# Patient Record
Sex: Female | Born: 1960 | Hispanic: Yes | Marital: Married
Health system: Northeastern US, Academic
[De-identification: ages and names within clinical notes are randomized; demographics above are authoritative.]

---

## 2019-10-19 MED ORDER — GABAPENTIN 300 MG CAPSULE
300 | ORAL_CAPSULE | Freq: Every evening | ORAL | 2 refills | 30.00000 days | Status: AC
Start: 2019-10-19 — End: 2020-03-16

## 2019-12-07 ENCOUNTER — Encounter: Admit: 2019-12-07 | Payer: PRIVATE HEALTH INSURANCE | Attending: Dermatology | Primary: Internal Medicine

## 2019-12-07 ENCOUNTER — Telehealth: Admit: 2019-12-07 | Payer: PRIVATE HEALTH INSURANCE | Attending: Dermatology | Primary: Internal Medicine

## 2019-12-07 NOTE — Telephone Encounter
Spoke with patient and recommended her to see Dr. Brooke Dare regarding hair loss.

## 2019-12-07 NOTE — Telephone Encounter
Patient wants name of md/referral for hair loss.   Please advise

## 2020-01-21 ENCOUNTER — Ambulatory Visit: Admit: 2020-01-21 | Payer: PRIVATE HEALTH INSURANCE | Attending: Internal Medicine | Primary: Internal Medicine

## 2020-01-21 ENCOUNTER — Inpatient Hospital Stay: Admit: 2020-01-21 | Discharge: 2020-01-21 | Payer: BLUE CROSS/BLUE SHIELD | Primary: Internal Medicine

## 2020-01-21 DIAGNOSIS — L659 Nonscarring hair loss, unspecified: Secondary | ICD-10-CM

## 2020-01-21 MED ORDER — CLOBETASOL 0.05 % SCALP SOLUTION
0.05 | TOPICAL | 2.00 refills | 30.00000 days | Status: AC
Start: 2020-01-21 — End: ?

## 2020-01-25 ENCOUNTER — Encounter: Admit: 2020-01-25 | Payer: PRIVATE HEALTH INSURANCE | Attending: Dermatology | Primary: Internal Medicine

## 2020-01-25 DIAGNOSIS — Z411 Encounter for cosmetic surgery: Secondary | ICD-10-CM

## 2020-01-25 DIAGNOSIS — U071 COVID-19: Secondary | ICD-10-CM

## 2020-01-25 DIAGNOSIS — S0300XA Dislocation of jaw, unspecified side, initial encounter: Secondary | ICD-10-CM

## 2020-01-25 DIAGNOSIS — K5190 Ulcerative colitis, unspecified, without complications: Secondary | ICD-10-CM

## 2020-01-25 DIAGNOSIS — E039 Hypothyroidism, unspecified: Secondary | ICD-10-CM

## 2020-01-25 DIAGNOSIS — E7800 Pure hypercholesterolemia, unspecified: Secondary | ICD-10-CM

## 2020-01-25 DIAGNOSIS — R040 Epistaxis: Secondary | ICD-10-CM

## 2020-01-25 NOTE — Patient Instructions
Post-Microneedling InstructionsAvoid vigorous exercise for first 24 hours after the procedure.Do not exfoliate the skin in the two weeks following the procedure.  Apply cold compresses for comfort as needed in the first few days after treatment.Tylenol as needed for swelling/discomfort. Avoid use of NSAIDS (ibuprofen or advil) for first 3 days after the procedure.Sleep with head elevated first night after treatment to help minimize swelling.Avoid application of active skin care items (i.e. Vit C, glycolic acid, Retin-A) - can restart 7 days after procedureAvoid direct sun exposure for at least 1 month after treatmentWhat to expect:Days 1-2: Redness, mild swelling.  Bruising can occur, but is less common. Mild scabbing or crusting may occur.Days 3-5: Pink color that fades.  Can resume regular application of make-upDay 7: Can resume normal skin care regimenSkin Care Post Microneedling TreatmentAM:Wash with Gentle Cleanser  - cerave, cetaphil, neutrogena,  La roche posay - samples providedApply hyaluronic acid serum provided to treatment area after washing wash Apply Moisturizer with SPF 30+ - mineral sunscreen preferable (i.e. Zinc, Al, Titanium) - samples providedPM:Wash with Gentle Cleanser (see above)Apply hyaluronic acid serum provided to treatment area after washing wash Moisturize with Bland, fragrance-free moisturizer - i.e. EltaMD Moisturizer, CeraVe Cream, CeraVe Ointment, Neutrogena hydrogel or Aquaphor - samples providedPost-[place] Resolve InstructionsAvoid vigorous exercise for first 24 hours after the procedure.Do not exfoliate the skin in the two weeks following the procedure.  Allow the peeling/sloughing process to occur naturally.Apply cold compresses for comfort as needed in the first few days after treatment. Apply for 10-15 minutes and repeat every hour as needed, particularly on the evening of treatment.You may shower or bathe normally, but avoid rubbing the skin with a washcloth, motorized brush or scrub.Tylenol as needed for swelling/discomfort. Sleep with head elevated first night after treatment to help minimize swelling.Can resume wearing make-up after 3 days.Avoid direct sun exposure for at least 3 months after treatmentIf you have a history of cold sores, please notify us.IF YOU DEVELOP BLISTERS OR SORES IN THE TREATMENT AREA, CALL THE OFFICE IMMEDIATELYWhat to expect:Days 1-2: Redness, mild swellingDays 1-4: Skin may develop sandpaper texture.  Mild swelling will resolve by day 4Days 3-5:  Resolution of redness from the deeper Resolve treatment.Peak results in 1-3 months Skin Care Post Resolve Laser Treatment - Can resume normal regimen at 1 weekAM:Wash with Gentle Cleanser  - cerave, cetaphil, neutrogena gentle skin,  La roche posay - samples providedApply Moisturizer with SPF 30+ - mineral sunscreen preferable (i.e. Zinc, Al, Titanium) - EltaMD SPF, LaRoche posay mineral SPF, Neutrogena Sheer Zinc - samples providedPM:Wash with Gentle Cleanser - as aboveMoisturize with Bland, fragrance-free moisturizer - i.e. EltaMD Moisturizer or PM therapy or Barrier renewal, LaRoche Double repair moisturizer or CeraVe Cream, CeraVe Ointment, Neutrogena hydrogel or Aquaphor - samples providedTAKE VALTREX MEDICATION TWICE DAILY IF PRESCRIBED.

## 2020-01-28 NOTE — Progress Notes
Please see Media folder for detailed note.

## 2020-02-26 ENCOUNTER — Ambulatory Visit: Admit: 2020-02-26 | Payer: BLUE CROSS/BLUE SHIELD | Primary: Internal Medicine

## 2020-02-26 ENCOUNTER — Ambulatory Visit: Admit: 2020-02-26 | Payer: PRIVATE HEALTH INSURANCE | Primary: Internal Medicine

## 2020-02-26 DIAGNOSIS — Z23 Encounter for immunization: Secondary | ICD-10-CM

## 2020-03-16 MED ORDER — GABAPENTIN 300 MG CAPSULE
300 | ORAL_CAPSULE | Freq: Every evening | ORAL | 2 refills | 30.00000 days | Status: AC
Start: 2020-03-16 — End: 2020-07-25

## 2020-03-16 MED ORDER — ROSUVASTATIN 5 MG TABLET
5 | ORAL_TABLET | ORAL | 4 refills | 90.00000 days | Status: AC
Start: 2020-03-16 — End: 2021-05-05

## 2020-03-18 ENCOUNTER — Ambulatory Visit: Admit: 2020-03-18 | Payer: PRIVATE HEALTH INSURANCE | Primary: Internal Medicine

## 2020-03-18 DIAGNOSIS — Z23 Encounter for immunization: Secondary | ICD-10-CM

## 2020-03-29 ENCOUNTER — Telehealth: Admit: 2020-03-29 | Payer: PRIVATE HEALTH INSURANCE | Attending: Dermatology | Primary: Internal Medicine

## 2020-03-29 NOTE — Telephone Encounter
Pt returning [place] call

## 2020-03-29 NOTE — Telephone Encounter
I called patient back returning her call. Patient had micro needling to scar on nose and states that she sees no improvement. I advised her that it can sometimes take more than one treatment. I advised her to e-mail a photo. She states that she will wait a little longer to see what she decides to do. I advised her to please call the office if she has any further questions or concerns.

## 2020-03-29 NOTE — Telephone Encounter
Patient called stating she would like to speak to nurse Sherri Rad in regards to the procedure she had on 01/25/2020 (microneedling on a scar).

## 2020-03-29 NOTE — Telephone Encounter
I called patient back today returning her call. Patient was unavailable. I left a message on voicemail advising patient to call the office when she is available.

## 2020-05-22 MED ORDER — AZELASTINE 137 MCG-FLUTICASONE 50 MCG/SPRAY NASAL SPRAY
137-50 | NASAL | 6 refills | 30.00000 days | Status: AC
Start: 2020-05-22 — End: 2021-09-01

## 2020-05-23 MED ORDER — ESTRADIOL 10 MCG VAGINAL TABLET
10 | ORAL_TABLET | VAGINAL | 1 refills | 63.00000 days | Status: AC
Start: 2020-05-23 — End: 2020-09-14

## 2020-06-18 ENCOUNTER — Inpatient Hospital Stay: Admit: 2020-06-18 | Discharge: 2020-06-18 | Payer: BLUE CROSS/BLUE SHIELD | Primary: Internal Medicine

## 2020-06-18 DIAGNOSIS — Z8049 Family history of malignant neoplasm of other genital organs: Secondary | ICD-10-CM

## 2020-06-24 MED ORDER — MISOPROSTOL 200 MCG TABLET
200 | ORAL_TABLET | ORAL | 1 refills | 1.00000 days | Status: AC
Start: 2020-06-24 — End: 2020-07-22

## 2020-07-25 MED ORDER — GABAPENTIN 300 MG CAPSULE
300 | ORAL_CAPSULE | ORAL | 4 refills | 30.00000 days | Status: AC
Start: 2020-07-25 — End: 2021-05-05

## 2020-08-06 ENCOUNTER — Inpatient Hospital Stay: Admit: 2020-08-06 | Discharge: 2020-08-06 | Payer: BLUE CROSS/BLUE SHIELD | Primary: Internal Medicine

## 2020-08-06 ENCOUNTER — Ambulatory Visit: Admit: 2020-08-06 | Payer: PRIVATE HEALTH INSURANCE | Attending: Internal Medicine | Primary: Internal Medicine

## 2020-08-06 DIAGNOSIS — Z0000 Encounter for general adult medical examination without abnormal findings: Secondary | ICD-10-CM

## 2020-08-06 DIAGNOSIS — E038 Other specified hypothyroidism: Secondary | ICD-10-CM

## 2020-08-06 DIAGNOSIS — K5180 Other ulcerative colitis without complications: Secondary | ICD-10-CM

## 2020-08-06 DIAGNOSIS — E7800 Pure hypercholesterolemia, unspecified: Secondary | ICD-10-CM

## 2020-08-07 MED ORDER — LEVOTHYROXINE 50 MCG TABLET
50 | ORAL_TABLET | ORAL | 4 refills | 90.00000 days | Status: AC
Start: 2020-08-07 — End: 2021-05-05

## 2020-09-14 MED ORDER — ESTRADIOL 10 MCG VAGINAL TABLET
10 | ORAL_TABLET | VAGINAL | 1 refills | 63.00000 days | Status: AC
Start: 2020-09-14 — End: 2020-11-11

## 2020-10-28 ENCOUNTER — Encounter: Admit: 2020-10-28 | Payer: PRIVATE HEALTH INSURANCE | Attending: Internal Medicine | Primary: Internal Medicine

## 2020-10-28 ENCOUNTER — Ambulatory Visit: Admit: 2020-10-28 | Payer: PRIVATE HEALTH INSURANCE | Attending: Internal Medicine | Primary: Internal Medicine

## 2020-10-28 ENCOUNTER — Inpatient Hospital Stay: Admit: 2020-10-28 | Discharge: 2020-10-28 | Payer: BLUE CROSS/BLUE SHIELD | Primary: Internal Medicine

## 2020-10-28 DIAGNOSIS — E038 Other specified hypothyroidism: Secondary | ICD-10-CM

## 2020-10-28 DIAGNOSIS — M26609 Unspecified temporomandibular joint disorder, unspecified side: Secondary | ICD-10-CM

## 2020-10-28 DIAGNOSIS — E559 Vitamin D deficiency, unspecified: Secondary | ICD-10-CM

## 2020-10-28 DIAGNOSIS — K5190 Ulcerative colitis, unspecified, without complications: Secondary | ICD-10-CM

## 2020-10-28 DIAGNOSIS — M549 Dorsalgia, unspecified: Secondary | ICD-10-CM

## 2020-10-28 DIAGNOSIS — E7800 Pure hypercholesterolemia, unspecified: Secondary | ICD-10-CM

## 2020-10-28 DIAGNOSIS — S0300XA Dislocation of jaw, unspecified side, initial encounter: Secondary | ICD-10-CM

## 2020-10-28 DIAGNOSIS — Z0000 Encounter for general adult medical examination without abnormal findings: Secondary | ICD-10-CM

## 2020-10-28 LAB — BASIC METABOLIC PANEL
BKR ANION GAP: 14 (ref 7–17)
BKR BLOOD UREA NITROGEN: 10 mg/dL (ref 6–20)
BKR BUN / CREAT RATIO: 14.5 (ref 8.0–23.0)
BKR CALCIUM: 10.6 mg/dL — ABNORMAL HIGH (ref 8.8–10.2)
BKR CHLORIDE: 100 mmol/L (ref 98–107)
BKR CO2: 25 mmol/L (ref 20–30)
BKR CREATININE: 0.69 mg/dL (ref 0.40–1.30)
BKR EGFR (AFR AMER): >60 mL/min/{1.73_m2} (ref >60)
BKR EGFR (NON AFRICAN AMERICAN): >60 mL/min/{1.73_m2} (ref >60)
BKR GLUCOSE: 88 mg/dL (ref 70–100)
BKR POTASSIUM: 4.6 mmol/L (ref 3.3–5.3)
BKR SODIUM: 139 mmol/L (ref 136–144)

## 2020-10-28 LAB — C-REACTIVE PROTEIN     (CRP): BKR C-REACTIVE PROTEIN, HIGH SENSITIVITY: 3.1 mg/L — ABNORMAL HIGH

## 2020-10-28 LAB — RHEUMATOID FACTOR: BKR RHEUMATOID FACTOR: <10 IU/mL (ref <14.0)

## 2020-10-28 LAB — SEDIMENTATION RATE (ESR): BKR SEDIMENTATION RATE, ERYTHROCYTE: 14 mm/hr (ref 0–20)

## 2020-10-28 LAB — CK     (BH GH L LMW YH): BKR CREATINE KINASE TOTAL: 89 U/L (ref 11–204)

## 2020-10-30 LAB — LYME ANTIBODIES W/RFLX TO CONFIRM (MODIFIED TWO-TIER TESTING)    (BH GH LMW YH): BKR LYME ANTIBODY: 0.25 ng/mL (ref <=0.90)

## 2020-11-01 LAB — ANA TITER AND PATTERN W/RFLX   (BH GH)
BKR ANA PATTERN: 100 mmol/L (ref 98–107)
BKR ANA TITER: 1:160 {titer} — AB (ref 3.3–5.3)

## 2020-11-01 LAB — ANA IFA SCREEN W/REFL TO TITER AND PATTERN, IFA: BKR ANTI-NUCLEAR ANTIBODY (ANA): POSITIVE — AB

## 2020-11-01 LAB — CYCLIC CITRUL PEPTIDE ANTIBODY, IGG
BKR CCP AB, IGG: 2.4 {ELISA'U} (ref <7.0)
BKR LDL CHOLESTEROL CALCULATED: 2.4 EliA U/mL — ABNORMAL HIGH (ref <7.0)

## 2020-11-03 LAB — VITAMIN D, 25-HYDROXY
BKR VITAMIN D, 25-HYDROXY TOTAL (YH): 50 ng/mL
BKR VITAMIN D2, 25-HYDROXY: <5 ng/mL
BKR VITAMIN D3, 25-HYDROXY: 50 ng/mL (ref 0.0–5.0)

## 2020-11-04 ENCOUNTER — Inpatient Hospital Stay: Admit: 2020-11-04 | Discharge: 2020-11-04 | Payer: BLUE CROSS/BLUE SHIELD | Primary: Internal Medicine

## 2020-11-04 DIAGNOSIS — R768 Other specified abnormal immunological findings in serum: Secondary | ICD-10-CM

## 2020-11-07 LAB — EXTRACTABLE NUCLEAR ANTIGEN ABS GROUP     (BH LMW YH)
BKR RNP (U1RNP) AB: 2.6 {ELISA'U} (ref <5.0)
BKR SM AB: 4.7 EliA U/mL (ref <7.0)
BKR SS-A AB: 0.7 {ELISA'U} (ref <7.0)
BKR SS-B AB: 0.7 EliA U/mL — ABNORMAL HIGH (ref <7.0)

## 2020-11-07 LAB — DSDNA ANTIBODY, IGG WITH IFA CONFIRMATION     (BH LMW YH): BKR DSDNA AB: 1.3 [IU]/mL (ref <10.0)

## 2020-11-11 MED ORDER — ESTRADIOL 10 MCG VAGINAL TABLET
10 | ORAL_TABLET | VAGINAL | 1 refills | 63.00000 days | Status: AC
Start: 2020-11-11 — End: 2020-11-28

## 2020-11-28 MED ORDER — ESTRADIOL 10 MCG VAGINAL TABLET
10 | ORAL_TABLET | VAGINAL | 1 refills | 63.00000 days | Status: AC
Start: 2020-11-28 — End: 2021-01-09

## 2021-01-04 ENCOUNTER — Inpatient Hospital Stay: Admit: 2021-01-04 | Discharge: 2021-01-04 | Payer: BLUE CROSS/BLUE SHIELD | Primary: Internal Medicine

## 2021-01-04 DIAGNOSIS — K760 Fatty (change of) liver, not elsewhere classified: Secondary | ICD-10-CM

## 2021-01-09 MED ORDER — ESTRADIOL 10 MCG VAGINAL TABLET
10 | ORAL_TABLET | VAGINAL | 4 refills | 63.00000 days | Status: AC
Start: 2021-01-09 — End: 2021-12-01

## 2021-01-12 ENCOUNTER — Encounter: Admit: 2021-01-12 | Payer: PRIVATE HEALTH INSURANCE | Attending: Adult Health | Primary: Internal Medicine

## 2021-01-12 DIAGNOSIS — Z01818 Encounter for other preprocedural examination: Secondary | ICD-10-CM

## 2021-01-16 ENCOUNTER — Encounter: Admit: 2021-01-16 | Payer: PRIVATE HEALTH INSURANCE | Primary: Internal Medicine

## 2021-01-16 DIAGNOSIS — E039 Hypothyroidism, unspecified: Secondary | ICD-10-CM

## 2021-01-16 DIAGNOSIS — U071 COVID-19: Secondary | ICD-10-CM

## 2021-01-16 DIAGNOSIS — R9389 Abnormal findings on diagnostic imaging of other specified body structures: Secondary | ICD-10-CM

## 2021-01-16 DIAGNOSIS — K5190 Ulcerative colitis, unspecified, without complications: Secondary | ICD-10-CM

## 2021-01-16 DIAGNOSIS — S0300XA Dislocation of jaw, unspecified side, initial encounter: Secondary | ICD-10-CM

## 2021-01-16 DIAGNOSIS — R040 Epistaxis: Secondary | ICD-10-CM

## 2021-01-16 DIAGNOSIS — E7800 Pure hypercholesterolemia, unspecified: Secondary | ICD-10-CM

## 2021-01-16 MED ORDER — AZELAIC ACID 15 % TOPICAL GEL
15 % | Status: AC
Start: 2021-01-16 — End: ?

## 2021-01-20 ENCOUNTER — Ambulatory Visit: Admit: 2021-01-20 | Payer: PRIVATE HEALTH INSURANCE | Attending: Anesthesiology | Primary: Internal Medicine

## 2021-01-20 ENCOUNTER — Inpatient Hospital Stay: Admit: 2021-01-20 | Discharge: 2021-01-20 | Payer: BLUE CROSS/BLUE SHIELD | Primary: Internal Medicine

## 2021-01-20 DIAGNOSIS — Z20822 Contact with and (suspected) exposure to covid-19: Secondary | ICD-10-CM

## 2021-01-20 DIAGNOSIS — K5120 Ulcerative (chronic) proctitis without complications: Secondary | ICD-10-CM

## 2021-01-20 DIAGNOSIS — Z01812 Encounter for preprocedural laboratory examination: Secondary | ICD-10-CM

## 2021-01-20 DIAGNOSIS — Z01818 Encounter for other preprocedural examination: Secondary | ICD-10-CM

## 2021-01-21 LAB — COVID-19 CLEARANCE OR FOR PLACEMENT ONLY: BKR SARS-COV-2 RNA (COVID-19) (YH): NEGATIVE

## 2021-01-23 ENCOUNTER — Inpatient Hospital Stay: Admit: 2021-01-23 | Discharge: 2021-01-23 | Payer: BLUE CROSS/BLUE SHIELD | Primary: Internal Medicine

## 2021-01-23 ENCOUNTER — Ambulatory Visit: Admit: 2021-01-23 | Payer: PRIVATE HEALTH INSURANCE | Attending: Anesthesiology | Primary: Internal Medicine

## 2021-01-23 ENCOUNTER — Encounter: Admit: 2021-01-23 | Payer: PRIVATE HEALTH INSURANCE | Primary: Internal Medicine

## 2021-01-23 DIAGNOSIS — Z87891 Personal history of nicotine dependence: Secondary | ICD-10-CM

## 2021-01-23 DIAGNOSIS — U071 COVID-19: Secondary | ICD-10-CM

## 2021-01-23 DIAGNOSIS — K769 Liver disease, unspecified: Secondary | ICD-10-CM

## 2021-01-23 DIAGNOSIS — Z888 Allergy status to other drugs, medicaments and biological substances status: Secondary | ICD-10-CM

## 2021-01-23 DIAGNOSIS — Z8616 Personal history of COVID-19: Secondary | ICD-10-CM

## 2021-01-23 DIAGNOSIS — R9389 Abnormal findings on diagnostic imaging of other specified body structures: Secondary | ICD-10-CM

## 2021-01-23 DIAGNOSIS — K5190 Ulcerative colitis, unspecified, without complications: Secondary | ICD-10-CM

## 2021-01-23 DIAGNOSIS — E7800 Pure hypercholesterolemia, unspecified: Secondary | ICD-10-CM

## 2021-01-23 DIAGNOSIS — Z79890 Hormone replacement therapy (postmenopausal): Secondary | ICD-10-CM

## 2021-01-23 DIAGNOSIS — E039 Hypothyroidism, unspecified: Secondary | ICD-10-CM

## 2021-01-23 DIAGNOSIS — S0300XA Dislocation of jaw, unspecified side, initial encounter: Secondary | ICD-10-CM

## 2021-01-23 DIAGNOSIS — R040 Epistaxis: Secondary | ICD-10-CM

## 2021-01-23 DIAGNOSIS — K5120 Ulcerative (chronic) proctitis without complications: Secondary | ICD-10-CM

## 2021-01-23 DIAGNOSIS — Z79899 Other long term (current) drug therapy: Secondary | ICD-10-CM

## 2021-01-23 MED ORDER — PROPOFOL 10 MG/ML INTRAVENOUS EMULSION
10 mg/mL | INTRAVENOUS | Status: DC | PRN
Start: 2021-01-23 — End: 2021-01-23
  Administered 2021-01-23 (×4): 10 mg/mL via INTRAVENOUS

## 2021-01-23 MED ORDER — LIDOCAINE (PF) 20 MG/ML (2 %) INJECTION SOLUTION
20 mg/mL (2 %) | INTRAVENOUS | Status: DC | PRN
Start: 2021-01-23 — End: 2021-01-23
  Administered 2021-01-23: 19:00:00 20 mg/mL (2 %) via INTRAVENOUS

## 2021-01-23 MED ORDER — LACTATED RINGERS INTRAVENOUS SOLUTION
INTRAVENOUS | Status: DC
Start: 2021-01-23 — End: 2021-01-28
  Administered 2021-01-23: 19:00:00 1000.000 mL/h via INTRAVENOUS

## 2021-01-23 NOTE — Anesthesia Pre-Procedure Evaluation
This is a 60 y.o. female scheduled for COLONOSCOPY, FLEXIBLE; DX, W/WO SPECIMENS/COLON DECOMP (SEP PROC) (N/A ).Review of Systems/ Medical HistoryPatient summary, nursing notes, pre-procedure vitals, height, weight and NPO status reviewed.No previous anesthesia concernsAnesthesia Evaluation: Estimated body mass index is 29.1 kg/m? as calculated from the following:  Height as of this encounter: 5' 1 (1.549 m).  Weight as of this encounter: 69.9 kg. CC/HPI: Past Medical History:09/11/2019: COVID-19No date: EpistaxisNo date: HypercholesteremiaNo date: Hypothyroidism9/27/2021: Thickened endometriumNo date: TMJ (dislocation of temporomandibular joint)No date: Ulcerative colitis [facility] Code)Past Surgical History:  Past Surgical History:No date: COLONOSCOPY2006: LAPAROSCOPIC OVARIAN CYSTECTOMYCardiovascular:Patient has a history of: hypercholesterolemia. Gastrointestinal/Genitourinary: -Gastrointestinal Disorders:  Patient has PUD.-Hepatic Disorders:  Patient has liver disease.Endocrine/Metabolic: -Thyroid Disorders:  Patient has hypothyroidism.Physical ExamCardiovascular:    normal exam  Rhythm: regularPulmonary:  normal exam  Patient's breath sounds clear to auscultationAirway:  Mallampati: IITM distance: >3 FBNeck ROM: fullDental:  normal exam  Anesthesia PlanASA 2 The primary anesthesia plan is  general. Anesthesia informed consent obtained. Anesthesia written consent obtainedConsent obtained from: patientThe post operative pain plan is per surgeon management.Plan discussed with Attending.Anesthesiologist's Pre Op NoteI personally evaluated and examined the patient prior to the intra-operative phase of care.

## 2021-01-23 NOTE — Other
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

## 2021-01-23 NOTE — Anesthesia Post-Procedure Evaluation
Anesthesia Post-op NotePatient: Lori Colon-ShermanProcedure(s):  Procedure(s) (LRB):COLONOSCOPY, FLEXIBLE; DX, W/WO SPECIMENS/COLON DECOMP (SEP PROC) (N/A) Patient location: PACULast Vitals:  I have noted the vital signs as listed in the nursing notes.Mental status recovered: patient participates in evaluation: YesVital signs reviewed: YesRespiratory function stable:YesAirway is patent: YesCardiovascular function and hydration status stable: YesPain control satisfactory: YesNausea and vomiting control satisfactory:YesThere were no known complications for this encounter.

## 2021-01-23 NOTE — Discharge Instructions
ColonoscopyCare After Please read the instructions outlined below and refer to this sheet in the next few weeks. These discharge instructions provide you with general information on caring for yourself after you leave the hospital. Your doctor may also give you specific instructions. While your treatment has been planned according to the most current medical practices available, unavoidable complications occasionally occur. If you have any problems or questions after discharge, please call your doctor. HOME CARE INSTRUCTIONS Activity You may resume your regular activity but move at a slower pace for the next 24 hours. Take frequent rest periods for the next 24 hours. Walking will help expel (get rid of) the air and reduce the bloated feeling in your abdomen. No driving until tomorrow (because of the anesthesia used during the test). Do not sign any important legal documents or operate any machinery for 24 hours (because of the anesthesia used during the test).Nutrition Drink plenty of fluids. You may resume your normal diet. Avoid alcoholic beverages for 24 hours or as instructed by your caregiver.Medications You may resume your normal medications unless your caregiver tells you otherwise. What you can expect today You may experience abdominal discomfort such as a feeling of fullness or gas pains. Follow-up If biopsies were performed or polyps removed you should receive a call from the office within 10 days. SEEK IMMEDIATE MEDICAL CARE IF: You have excessive nausea (feeling sick to your stomach) and/or vomiting. You have severe abdominal pain and distention (swelling).  You have a temperature over 100? F (37.8? C). You have rectal bleeding or vomiting of blood.

## 2021-01-23 NOTE — H&P
HPI: 60 y.o. female here for colon Past Medical History: Diagnosis Date ? COVID-19 09/11/2019 ? Epistaxis  ? Hypercholesteremia  ? Hypothyroidism  ? Thickened endometrium 07/11/2020 ? TMJ (dislocation of temporomandibular joint)  ? Ulcerative colitis (HC Code)  Past Surgical History: Procedure Laterality Date ? LAPAROSCOPIC OVARIAN CYSTECTOMY  2006 Social History Socioeconomic History ? Marital status: Married   Spouse name: Not on file ? Number of children: Not on file ? Years of education: Not on file ? Highest education level: Not on file Occupational History ? Not on file Tobacco Use ? Smoking status: Former Smoker ? Smokeless tobacco: Never Used ? Tobacco comment: quit about 7 years ago Vaping Use ? Vaping Use: Never used Substance and Sexual Activity ? Alcohol use: Yes   Alcohol/week: 4.0 standard drinks   Types: 2 Glasses of wine, 2 Cans of beer per week   Comment: occ ? Drug use: Never ? Sexual activity: Not on file Other Topics Concern ? Not on file Social History Narrative ? Not on file Social Determinants of Health Financial Resource Strain:  ? Difficulty of Paying Living Expenses:  Food Insecurity:  ? Worried About Programme researcher, broadcasting/film/video in the Last Year:  ? Barista in the Last Year:  Transportation Needs:  ? Freight forwarder (Medical):  ? Lack of Transportation (Non-Medical):  Physical Activity:  ? Days of Exercise per Week:  ? Minutes of Exercise per Session:  Stress:  ? Feeling of Stress :  Social Connections:  ? Frequency of Communication with Friends and Family:  ? Frequency of Social Gatherings with Friends and Family:  ? Attends Religious Services:  ? Active Member of Clubs or Organizations:  ? Attends Banker Meetings:  ? Marital Status:  Intimate Partner Violence:  ? Fear of Current or Ex-Partner:  ? Emotionally Abused:  ? Physically Abused:  ? Sexually Abused:  Family History Problem Relation Age of Onset ? Uterine cancer Mother  ? Breast cancer Neg Hx  ? Colon cancer Neg Hx  ? Ovarian cancer Neg Hx  Allergies Allergen Reactions ? Methyldibromoglutaronitrile  ? Oleamidopropyl Dimethylamine  Current Outpatient Medications on File Prior to Encounter Medication Sig Dispense Refill ? azelastine-fluticasone (DYMISTA) 137-50 mcg/spray nasal spray USE 1 SPRAY IN BOTH  NOSTRILS TWICE DAILY 69 g 5 ? cholecalciferol, vitamin D3, (VITAMIN D3) 1,000 unit capsule Take 3,000 Units by mouth daily.    ? clobetasoL (TEMOVATE) 0.05 % external solution    ? crisaborole (EUCRISA) 2 % Oint Apply topically to the affected areas on the arms and face as needed for maintenance. 60 g 2 ? estradioL (VAGIFEM) 10 mcg vaginal tablet PLACE 1 INSERT VAGINALLY  TWICE WEEKLY 24 tablet 3 ? gabapentin (NEURONTIN) 300 mg capsule TAKE 2 CAPSULES BY MOUTH AT NIGHT 180 capsule 3 ? levothyroxine (SYNTHROID, LEVOTHROID) 50 MCG tablet TAKE 1 TABLET BY MOUTH  DAILY 90 tablet 3 ? mesalamine (ASACOL HD) 800 mg HD delayed release tablet Take 1,600 mg by mouth daily.   ? mesalamine (CANASA) 1000 MG suppository Place 1 suppository (1,000 mg total) rectally nightly. 90 suppository 0 ? rosuvastatin (CRESTOR) 5 mg tablet TAKE 1 TABLET BY MOUTH  DAILY 90 tablet 3 ? azelaic acid (FINACEA) 15 % gel    No current facility-administered medications on file prior to encounter. Current Outpatient Medications Medication Sig ? azelastine-fluticasone (DYMISTA) 137-50 mcg/spray nasal spray USE 1 SPRAY IN BOTH  NOSTRILS TWICE DAILY ? cholecalciferol, vitamin D3, (VITAMIN D3) 1,000 unit capsule Take  3,000 Units by mouth daily.  ? clobetasoL (TEMOVATE) 0.05 % external solution  ? crisaborole (EUCRISA) 2 % Oint Apply topically to the affected areas on the arms and face as needed for maintenance. ? estradioL (VAGIFEM) 10 mcg vaginal tablet PLACE 1 INSERT VAGINALLY  TWICE WEEKLY ? gabapentin (NEURONTIN) 300 mg capsule TAKE 2 CAPSULES BY MOUTH AT NIGHT ? levothyroxine (SYNTHROID, LEVOTHROID) 50 MCG tablet TAKE 1 TABLET BY MOUTH  DAILY ? mesalamine (ASACOL HD) 800 mg HD delayed release tablet Take 1,600 mg by mouth daily. ? mesalamine (CANASA) 1000 MG suppository Place 1 suppository (1,000 mg total) rectally nightly. ? rosuvastatin (CRESTOR) 5 mg tablet TAKE 1 TABLET BY MOUTH  DAILY ? azelaic acid (FINACEA) 15 % gel  No current facility-administered medications for this encounter. P/EWt 70.3 kg  - BMI 29.05 kg/m? General: in NADHEENT: EOMICV: regular rateLungs: clearAbd: soft, NT/NDA/P60 y.o./female here for colonWritten informed consent obtained. _

## 2021-01-23 NOTE — Other
Post Anesthesia Transfer of Care NotePatient: Lori Colon-ShermanProcedure(s) Performed: Procedure(s) (LRB):COLONOSCOPY, FLEXIBLE; DX, W/WO SPECIMENS/COLON DECOMP (SEP PROC) (N/A) Patient location: PACU Last Vitals: Vitals Value Taken Time BP 83/48 01/23/21 1534 Temp 97 01/23/21 1536 Pulse 74 01/23/21 1535 Resp 16 01/23/21 1536 SpO2 100 % 01/23/21 1535 Vitals shown include unvalidated device data.Level of consciousness: awakeTransport Vital Signs:  Stable since the last set of recorded intra-operative vital signsIntra-operative Complications: noneIntra-operative Intake & Output and Antibiotics as per Anesthesia record and discussed with the RN.

## 2021-03-02 MED ORDER — METRONIDAZOLE 0.75 % TOPICAL CREAM
0.75 | TOPICAL | 1.00 refills | 30.00000 days | Status: AC
Start: 2021-03-02 — End: 2021-06-13

## 2021-03-02 MED ORDER — PROCTO-MED HC 2.5 % TOPICAL CREAM PERINEAL APPLICATOR
2.5 | TOPICAL | 1.00 refills | 22.50000 days | Status: AC
Start: 2021-03-02 — End: 2021-06-13

## 2021-03-04 ENCOUNTER — Inpatient Hospital Stay: Admit: 2021-03-04 | Discharge: 2021-03-04 | Payer: BLUE CROSS/BLUE SHIELD | Primary: Internal Medicine

## 2021-03-04 DIAGNOSIS — Z780 Asymptomatic menopausal state: Secondary | ICD-10-CM

## 2021-03-04 DIAGNOSIS — E038 Other specified hypothyroidism: Secondary | ICD-10-CM

## 2021-03-04 DIAGNOSIS — M2550 Pain in unspecified joint: Secondary | ICD-10-CM

## 2021-03-04 LAB — TSH: BKR THYROID STIMULATING HORMONE: 1.95 ??IU/mL

## 2021-03-04 LAB — C-REACTIVE PROTEIN     (CRP): BKR C-REACTIVE PROTEIN, HIGH SENSITIVITY: 1.6 mg/L

## 2021-03-04 LAB — CALCIUM: BKR CALCIUM: 10 mg/dL (ref 8.8–10.2)

## 2021-03-04 LAB — ESTRADIOL: BKR ESTRADIOL LEVEL: <25 pg/mL

## 2021-03-04 LAB — PTH, INTACT     (BH GH LMW YH): BKR PARATHYROID HORMONE INTACT: 50.2 pg/mL (ref 15.0–65.0)

## 2021-03-07 LAB — VITAMIN D, 25-HYDROXY
BKR VITAMIN D, 25-HYDROXY TOTAL (YH): 46 ng/mL
BKR VITAMIN D2, 25-HYDROXY: <5 ng/mL
BKR VITAMIN D3, 25-HYDROXY: 46 ng/mL

## 2021-04-07 ENCOUNTER — Encounter: Admit: 2021-04-07 | Payer: PRIVATE HEALTH INSURANCE | Attending: Internal Medicine | Primary: Internal Medicine

## 2021-04-07 ENCOUNTER — Ambulatory Visit: Admit: 2021-04-07 | Payer: BLUE CROSS/BLUE SHIELD | Attending: Internal Medicine | Primary: Internal Medicine

## 2021-04-07 ENCOUNTER — Inpatient Hospital Stay: Admit: 2021-04-07 | Discharge: 2021-04-07 | Payer: BLUE CROSS/BLUE SHIELD | Primary: Internal Medicine

## 2021-04-07 DIAGNOSIS — M2550 Pain in unspecified joint: Secondary | ICD-10-CM

## 2021-04-07 LAB — C-REACTIVE PROTEIN     (CRP): BKR C-REACTIVE PROTEIN, HIGH SENSITIVITY: 1.8 mg/L

## 2021-04-11 LAB — ANA TITER AND PATTERN W/RFLX   (BH GH): BKR ANA TITER: 1:80 {titer} — AB

## 2021-04-11 LAB — ANA IFA SCREEN W/REFL TO TITER AND PATTERN, IFA: BKR ANTI-NUCLEAR ANTIBODY (ANA): POSITIVE — AB

## 2021-05-05 MED ORDER — GABAPENTIN 300 MG CAPSULE
300 | ORAL_CAPSULE | ORAL | 4 refills | 30.00000 days | Status: AC
Start: 2021-05-05 — End: 2021-07-04

## 2021-05-05 MED ORDER — LEVOTHYROXINE 50 MCG TABLET
50 | ORAL_TABLET | ORAL | 4 refills | 90.00000 days | Status: AC
Start: 2021-05-05 — End: 2021-06-13

## 2021-05-05 MED ORDER — ROSUVASTATIN 5 MG TABLET
5 | ORAL_TABLET | ORAL | 4 refills | 90.00000 days | Status: AC
Start: 2021-05-05 — End: 2022-01-23

## 2021-06-13 MED ORDER — CYCLOBENZAPRINE 10 MG TABLET
10 | ORAL_TABLET | Freq: Three times a day (TID) | ORAL | 1 refills | 10.00000 days | Status: AC | PRN
Start: 2021-06-13 — End: ?

## 2021-06-13 MED ORDER — SEMAGLUTIDE 0.25 MG OR 0.5 MG (2 MG/1.5 ML) SUBCUTANEOUS PEN INJECTOR
0.25 | SUBCUTANEOUS | 1 refills | 28.00000 days | Status: AC
Start: 2021-06-13 — End: 2021-08-29

## 2021-06-13 MED ORDER — LIALDA 1.2 GRAM TABLET,DELAYED RELEASE
1.2 | ORAL | 4.00 refills | 30.00000 days | Status: AC
Start: 2021-06-13 — End: 2021-09-11

## 2021-06-13 MED ORDER — SYNTHROID 50 MCG TABLET
50 | ORAL_TABLET | Freq: Every day | ORAL | 1 refills | 90.00000 days | Status: AC
Start: 2021-06-13 — End: 2021-08-15

## 2021-07-04 MED ORDER — GABAPENTIN 300 MG CAPSULE
300 | ORAL_CAPSULE | Freq: Three times a day (TID) | ORAL | 2 refills | 30.00000 days | Status: AC
Start: 2021-07-04 — End: 2021-08-29

## 2021-07-11 ENCOUNTER — Ambulatory Visit: Admit: 2021-07-11 | Payer: BLUE CROSS/BLUE SHIELD | Primary: Internal Medicine

## 2021-08-15 MED ORDER — SYNTHROID 50 MCG TABLET
50 | ORAL_TABLET | ORAL | 4 refills | 90.00000 days | Status: AC
Start: 2021-08-15 — End: 2022-07-03

## 2021-08-29 MED ORDER — SEMAGLUTIDE 0.25 MG OR 0.5 MG (2 MG/1.5 ML) SUBCUTANEOUS PEN INJECTOR
0.25 | SUBCUTANEOUS | 1 refills | 28.00000 days | Status: AC
Start: 2021-08-29 — End: 2021-09-25

## 2021-08-29 MED ORDER — GABAPENTIN 300 MG CAPSULE
300 | ORAL_CAPSULE | ORAL | 4 refills | 30.00000 days | Status: AC
Start: 2021-08-29 — End: 2022-06-12

## 2021-09-01 ENCOUNTER — Encounter: Admit: 2021-09-01 | Payer: PRIVATE HEALTH INSURANCE | Attending: Internal Medicine | Primary: Internal Medicine

## 2021-09-01 MED ORDER — AZELASTINE 137 MCG-FLUTICASONE 50 MCG/SPRAY NASAL SPRAY
137-50 | NASAL | 6 refills | 30.00000 days | Status: AC
Start: 2021-09-01 — End: 2022-12-20

## 2021-09-11 MED ORDER — LIALDA 1.2 GRAM TABLET,DELAYED RELEASE
1.2 gram | ORAL_TABLET | 4 refills | Status: AC
Start: 2021-09-11 — End: ?

## 2021-09-11 NOTE — Telephone Encounter
Pt is calling to follow up on status of LIALDAInformed that medication is sent to her pharmacy todayNo further actions needed at this time

## 2021-09-25 MED ORDER — SEMAGLUTIDE 1 MG/DOSE (4 MG/3 ML) SUBCUTANEOUS PEN INJECTOR
1 | SUBCUTANEOUS | 3 refills | 28.00000 days | Status: AC
Start: 2021-09-25 — End: 2021-11-26

## 2021-09-25 MED ORDER — SEMAGLUTIDE 1 MG/DOSE (4 MG/3 ML) SUBCUTANEOUS PEN INJECTOR
1 | SUBCUTANEOUS | 3 refills | 28.00000 days | Status: AC
Start: 2021-09-25 — End: 2021-09-25

## 2021-10-26 ENCOUNTER — Encounter: Admit: 2021-10-26 | Payer: BLUE CROSS/BLUE SHIELD | Attending: Internal Medicine

## 2021-11-26 ENCOUNTER — Encounter: Admit: 2021-11-26 | Payer: PRIVATE HEALTH INSURANCE | Attending: Internal Medicine

## 2021-11-26 MED ORDER — OZEMPIC 2 MG/DOSE (8 MG/3 ML) SUBCUTANEOUS PEN INJECTOR
2 | SUBCUTANEOUS | 2 refills | 28.00000 days | Status: AC
Start: 2021-11-26 — End: 2022-04-06

## 2021-11-30 ENCOUNTER — Encounter: Admit: 2021-11-30 | Payer: PRIVATE HEALTH INSURANCE

## 2021-12-01 MED ORDER — ESTRADIOL 10 MCG VAGINAL TABLET
10 | ORAL_TABLET | VAGINAL | 1 refills | 63.00000 days | Status: AC
Start: 2021-12-01 — End: 2022-01-24

## 2021-12-07 ENCOUNTER — Encounter: Admit: 2021-12-07 | Payer: BLUE CROSS/BLUE SHIELD | Attending: Internal Medicine

## 2021-12-21 ENCOUNTER — Encounter: Admit: 2021-12-21 | Payer: BLUE CROSS/BLUE SHIELD | Attending: Internal Medicine

## 2022-01-15 ENCOUNTER — Encounter: Admit: 2022-01-15 | Payer: BLUE CROSS/BLUE SHIELD

## 2022-01-23 ENCOUNTER — Encounter: Admit: 2022-01-23 | Payer: PRIVATE HEALTH INSURANCE

## 2022-01-23 ENCOUNTER — Encounter: Admit: 2022-01-23 | Payer: PRIVATE HEALTH INSURANCE | Attending: Internal Medicine

## 2022-01-23 MED ORDER — ROSUVASTATIN 5 MG TABLET
5 | ORAL_TABLET | ORAL | 4 refills | 90.00000 days | Status: AC
Start: 2022-01-23 — End: 2023-01-02

## 2022-01-24 MED ORDER — ESTRADIOL 10 MCG VAGINAL TABLET
10 | ORAL_TABLET | VAGINAL | 1 refills | 63.00000 days | Status: AC
Start: 2022-01-24 — End: 2022-05-16

## 2022-03-01 ENCOUNTER — Encounter: Admit: 2022-03-01 | Payer: BLUE CROSS/BLUE SHIELD | Attending: Internal Medicine

## 2022-03-21 ENCOUNTER — Ambulatory Visit: Admit: 2022-03-21 | Payer: BLUE CROSS/BLUE SHIELD | Attending: Pain Medicine | Primary: Internal Medicine

## 2022-03-28 ENCOUNTER — Encounter: Admit: 2022-03-28 | Payer: BLUE CROSS/BLUE SHIELD

## 2022-04-05 ENCOUNTER — Encounter: Admit: 2022-04-05 | Payer: PRIVATE HEALTH INSURANCE | Attending: Internal Medicine

## 2022-04-06 MED ORDER — OZEMPIC 2 MG/DOSE (8 MG/3 ML) SUBCUTANEOUS PEN INJECTOR
2 | SUBCUTANEOUS | 4 refills | 28.00000 days | Status: AC
Start: 2022-04-06 — End: ?

## 2022-05-03 ENCOUNTER — Telehealth: Admit: 2022-05-03 | Payer: PRIVATE HEALTH INSURANCE | Attending: Internal Medicine | Primary: Internal Medicine

## 2022-05-03 MED ORDER — HYDROCORTISONE 2.5 % TOPICAL CREAM WITH PERINEAL APPLICATOR
2.5 % | Freq: Two times a day (BID) | RECTAL | 2 refills | Status: AC
Start: 2022-05-03 — End: ?

## 2022-05-03 NOTE — Telephone Encounter
Patient has bleeding hemorrhoids but also has colitis which gives her cause for concern. She'd like to speak to a nurse.

## 2022-05-03 NOTE — Telephone Encounter
Pt reports bleeding from her hemorrhoid. Denies abdominal pain, nausea, vomiting. She has used anusol in the past and would like to retry.

## 2022-05-10 ENCOUNTER — Encounter: Admit: 2022-05-10 | Payer: PRIVATE HEALTH INSURANCE

## 2022-05-10 ENCOUNTER — Telehealth: Admit: 2022-05-10 | Payer: PRIVATE HEALTH INSURANCE | Attending: Internal Medicine

## 2022-05-15 ENCOUNTER — Inpatient Hospital Stay: Admit: 2022-05-15 | Discharge: 2022-05-15 | Payer: BLUE CROSS/BLUE SHIELD | Primary: Internal Medicine

## 2022-05-15 DIAGNOSIS — M8580 Other specified disorders of bone density and structure, unspecified site: Secondary | ICD-10-CM

## 2022-05-15 DIAGNOSIS — Z0000 Encounter for general adult medical examination without abnormal findings: Secondary | ICD-10-CM

## 2022-05-15 DIAGNOSIS — E785 Hyperlipidemia, unspecified: Secondary | ICD-10-CM

## 2022-05-15 DIAGNOSIS — E038 Other specified hypothyroidism: Secondary | ICD-10-CM

## 2022-05-15 DIAGNOSIS — R5383 Other fatigue: Secondary | ICD-10-CM

## 2022-05-15 LAB — CBC WITH AUTO DIFFERENTIAL
BKR BLOOD UREA NITROGEN: 400 x1000/??L (ref 150–420)
BKR POTASSIUM: 40.8 % (ref 35.00–45.00)
BKR WAM ABSOLUTE IMMATURE GRANULOCYTES.: 0.03 x 1000/??L (ref 0.00–0.30)
BKR WAM ABSOLUTE LYMPHOCYTE COUNT.: 2.31 x 1000/??L (ref 0.60–3.70)
BKR WAM ABSOLUTE NRBC (2 DEC): 0 x 1000/??L (ref 0.00–1.00)
BKR WAM ANALYZER ANC: 4.97 x 1000/??L (ref 2.00–7.60)
BKR WAM BASOPHIL ABSOLUTE COUNT.: 0.11 x 1000/??L (ref 0.00–1.00)
BKR WAM BASOPHILS: 1.3 % (ref 0.0–1.4)
BKR WAM EOSINOPHIL ABSOLUTE COUNT.: 0.19 x 1000/??L (ref 0.00–1.00)
BKR WAM EOSINOPHILS: 2.3 % (ref 0.0–5.0)
BKR WAM HEMATOCRIT (2 DEC): 40.8 % (ref 35.00–45.00)
BKR WAM HEMOGLOBIN: 12.7 g/dL (ref 11.7–15.5)
BKR WAM IMMATURE GRANULOCYTES: 0.4 % (ref 0.0–1.0)
BKR WAM LYMPHOCYTES: 28.3 % (ref 17.0–50.0)
BKR WAM MCH (PG): 30.6 pg (ref 27.0–33.0)
BKR WAM MCHC: 31.1 g/dL (ref 31.0–36.0)
BKR WAM MCV: 98.3 fL (ref 80.0–100.0)
BKR WAM MONOCYTE ABSOLUTE COUNT.: 0.54 x 1000/??L (ref 0.00–1.00)
BKR WAM MONOCYTES: 6.6 % (ref 4.0–12.0)
BKR WAM MPV: 10.4 fL (ref 8.0–12.0)
BKR WAM NEUTROPHILS: 61.1 % (ref 39.0–72.0)
BKR WAM PLATELETS: 400 x1000/ÂµL (ref 150–420)
BKR WAM RDW-CV: 12.2 % (ref 11.0–15.0)
BKR WAM RED BLOOD CELL COUNT.: 4.15 M/??L (ref 4.00–6.00)
BKR WAM WHITE BLOOD CELL COUNT: 8.2 x1000/??L (ref 4.0–11.0)

## 2022-05-15 LAB — COMPREHENSIVE METABOLIC PANEL
BKR A/G RATIO: 1.8 (ref 1.0–2.2)
BKR ALANINE AMINOTRANSFERASE (ALT): 37 U/L — ABNORMAL HIGH (ref 10–35)
BKR ALBUMIN: 4.6 g/dL (ref 3.6–4.9)
BKR ALKALINE PHOSPHATASE: 74 U/L (ref 9–122)
BKR ANION GAP: 10 (ref 7–17)
BKR ASPARTATE AMINOTRANSFERASE (AST): 25 U/L (ref 10–35)
BKR AST/ALT RATIO: 0.7
BKR BILIRUBIN TOTAL: 0.5 mg/dL (ref 0.0–<=1.2)
BKR BUN / CREAT RATIO: 14.9 % (ref 8.0–23.0)
BKR CALCIUM: 10.1 mg/dL (ref 8.8–10.2)
BKR CHLORIDE: 104 mmol/L (ref 98–107)
BKR CO2: 25 mmol/L (ref 20–30)
BKR CREATININE: 0.67 mg/dL (ref 0.40–1.30)
BKR EGFR, CREATININE (CKD-EPI 2021): >60 mL/min/{1.73_m2} (ref >=60)
BKR GLOBULIN: 2.5 g/dL (ref 2.3–3.5)
BKR GLUCOSE: 87 mg/dL (ref 70–100)
BKR PROTEIN TOTAL: 7.1 g/dL (ref 6.6–8.7)
BKR SODIUM: 139 mmol/L (ref 136–144)
BKR WAM NUCLEATED RED BLOOD CELLS: 37 U/L — ABNORMAL HIGH (ref 10–35)

## 2022-05-15 LAB — LIPID PANEL
BKR CHOLESTEROL/HDL RATIO: 2.8 (ref 0.0–5.0)
BKR CHOLESTEROL: 153 mg/dL
BKR HDL CHOLESTEROL: 54 mg/dL (ref >=40)
BKR LDL CHOLESTEROL SAMPSON CALCULATED: 80 mg/dL
BKR TRIGLYCERIDES: 107 mg/dL

## 2022-05-15 LAB — TSH W/REFLEX TO FT4     (BH GH LMW Q YH): BKR THYROID STIMULATING HORMONE: 1.68 ??IU/mL

## 2022-05-16 ENCOUNTER — Encounter: Admit: 2022-05-16 | Payer: PRIVATE HEALTH INSURANCE

## 2022-05-16 MED ORDER — ESTRADIOL 10 MCG VAGINAL TABLET
10 | ORAL_TABLET | VAGINAL | 1 refills | 63.00000 days | Status: AC
Start: 2022-05-16 — End: 2022-06-27

## 2022-05-17 ENCOUNTER — Ambulatory Visit: Admit: 2022-05-17 | Payer: BLUE CROSS/BLUE SHIELD | Attending: Pain Medicine | Primary: Internal Medicine

## 2022-05-17 ENCOUNTER — Encounter: Admit: 2022-05-17 | Payer: PRIVATE HEALTH INSURANCE | Attending: Internal Medicine | Primary: Internal Medicine

## 2022-05-17 ENCOUNTER — Ambulatory Visit: Admit: 2022-05-17 | Payer: BLUE CROSS/BLUE SHIELD | Attending: Internal Medicine | Primary: Internal Medicine

## 2022-05-17 DIAGNOSIS — K5190 Ulcerative colitis, unspecified, without complications: Secondary | ICD-10-CM

## 2022-05-17 DIAGNOSIS — S0300XA Dislocation of jaw, unspecified side, initial encounter: Secondary | ICD-10-CM

## 2022-05-17 DIAGNOSIS — E7800 Pure hypercholesterolemia, unspecified: Secondary | ICD-10-CM

## 2022-05-17 DIAGNOSIS — E039 Hypothyroidism, unspecified: Secondary | ICD-10-CM

## 2022-05-17 DIAGNOSIS — U071 COVID-19: Secondary | ICD-10-CM

## 2022-05-17 DIAGNOSIS — R040 Epistaxis: Secondary | ICD-10-CM

## 2022-05-17 DIAGNOSIS — R9389 Abnormal findings on diagnostic imaging of other specified body structures: Secondary | ICD-10-CM

## 2022-05-17 MED ORDER — LIALDA 1.2 GRAM TABLET,DELAYED RELEASE
1.2 gram | ORAL_TABLET | Freq: Every day | ORAL | 12 refills | Status: AC
Start: 2022-05-17 — End: ?

## 2022-05-17 MED ORDER — MESALAMINE 1,000 MG RECTAL SUPPOSITORY
1000 mg | Freq: Every evening | RECTAL | 1 refills | Status: AC
Start: 2022-05-17 — End: ?

## 2022-05-17 NOTE — Progress Notes
[facility]:The patient is a 61 years old female with PMH of UC and  fatty liver presenting with colitis follow-up. -had 3 BM, two in the morning and one in the evening of loose stools -she believes that lialda is leading to side effects -she lost 10-12 pounds while ozempic IBDx: Left sided UC in 2008, suppository and asacol has always been her treatment. Last visit:01/11/2021; for her ulcerative colitis.  She has been on 5-ASA for the entire time. She is on LialdaCurrently and sometimes use 5-ASA suppository, maybe twice a year if she starts to see any bleeding. Her bleeding is just on the toilet paper. She has one stool per day, sometimes hard, but she uses a stool softener, so usually does feel more to a [place] stool No. 4 or No. 5. She does complain about having muscle tenderness and aching. She is due for her dysplasia screening colonoscopy. She wants to do Sutab for her clean out. Her symptoms do not sound consistent with arthritis, and seems muscle pains, perhaps it is fibromyalgia. Family hx cancer: none Last colon: 2022; Mild (Mayo Score 1) proctitis ulcerative colitis (w/ Dr. Hyacinth Meeker for UC assessment) ROS:  Positive and pertinent negatives as per HPI in all categories reviewed and were either negative or noncontributory.PMHx:Past Medical History: Diagnosis Date ? COVID-19 09/11/2019 ? Epistaxis  ? Hypercholesteremia  ? Hypothyroidism  ? Thickened endometrium 07/11/2020 ? TMJ (dislocation of temporomandibular joint)  ? Ulcerative colitis (HC Code)  PSHx:Past Surgical History: Procedure Laterality Date ? COLONOSCOPY   ? LAPAROSCOPIC OVARIAN CYSTECTOMY  2006 Medications:Current Outpatient Medications on File Prior to Visit Medication Sig Dispense Refill ? azelaic acid (FINACEA) 15 % gel  (Patient not taking: Reported on 12/07/2021)   ? azelastine-fluticasone (DYMISTA) 137-50 mcg/spray nasal spray USE 1 SPRAY IN BOTH  NOSTRILS TWICE DAILY 69 g 5 ? cholecalciferol, vitamin D3, 25 mcg (1,000 unit) capsule Take 3 capsules (3,000 Units total) by mouth daily.   ? clobetasoL (TEMOVATE) 0.05 % external solution    ? crisaborole (EUCRISA) 2 % Oint Apply topically to the affected areas on the arms and face as needed for maintenance. 60 g 2 ? estradioL (VAGIFEM) 10 mcg vaginal tablet PLACE 1 INSERT VAGINALLY TWICE  WEEKLY 24 tablet 0 ? gabapentin (NEURONTIN) 300 mg capsule TAKE 2 CAPSULES BY MOUTH 3  TIMES DAILY WITH MEALS 540 capsule 3 ? LIALDA 1.2 gram delayed release tablet TAKE 4 TABLETS BY MOUTH  ONCE DAILY 360 tablet 3 ? mesalamine (CANASA) 1000 MG suppository Place 1 suppository (1,000 mg total) rectally nightly. 90 suppository 0 ? OZEMPIC 2 mg/dose (8 mg/3 mL) pen injector INJECT SUBCUTANEOUSLY 2 MG EVERY WEEK 9 mL 3 ? rosuvastatin (CRESTOR) 5 mg tablet TAKE 1 TABLET BY MOUTH  DAILY 90 tablet 3 ? SYNTHROID 50 mcg tablet TAKE 1 TABLET BY MOUTH  DAILY 90 tablet 3 No current facility-administered medications on file prior to visit. Allergies:Allergies Allergen Reactions ? Methyldibromoglutaronitrile  ? Oleamidopropyl Dimethylamine  Physical Exam:Vitals: Wt: 12/07/21 69.4 kg 08/29/21 71.2 kg 07/04/21 72.6 kg 06/13/21 73.9 kg 01/23/21 69.9 kg 01/09/21 73 kg There were no vitals filed for this visit.General Appearance: Well appearing in NADEyes: EOM intact, PERRLNeck: Neck supple, no cervical LADENMT: OP clear, MMMCVS: Audible S1 and S2Chest: LCTA B/LAbdomen: soft, NT/ND with + BS, no hepatosplenomegaly appreciatedExtremities: WWP, no edema B/LNeuro: Alert and oriented x 3Labs:  Labs have been reviewed by me and are notable for: Latest Reference Range & Units 08/01/18 08:51 08/07/19 08:59 08/06/20 08:16 05/15/22 09:30  Alanine Aminotransferase (ALT) 10 - 35 U/L 30 26 25  37 (H) (H): Data is abnormally high Latest Reference Range & Units 01/21/20 21:10 Ferritin 13 - 150 ng/mL 232 (H) (H): Data is abnormally highImaging:2022; US Abdomen for history of fatty liver Median value for the liver is 1.34 m/s (5.38 kPa). Fibrosis score and severity of fibrosis is F0-F1.  Assessment and Plan:The patient is a 61 years old female with PMH of UC and  fatty liver presenting with colitis follow-up. -advise to trial one week off lialda to determine if it is muscle pains -Muscle aches/pains - discuss with Dr Tasia Catchings - ? Fibromyalgia , ? Statins -Colonoscopy - every 2 years , due next year  2024. Scribed for Monico Hoar, MD by Ave Filter, medical scribe May 17, 2022  The documentation recorded by the scribe accurately reflects the services I personally performed and the decisions made by me. I reviewed and confirmed all material entered and/or pre-charted by the scribe. Monico Hoar, MD, F.A.C.GAssistant Professor of [facility] [phone]

## 2022-05-17 NOTE — Patient Instructions
Muscle aches/pains - discuss with Dr Tasia Catchings - ? Fibromyalgia Colonoscopy - every 2 years , due next year  2024.

## 2022-05-20 LAB — VITAMIN D, 25-HYDROXY
BKR VITAMIN D, 25-HYDROXY TOTAL (YH): 42 ng/mL
BKR VITAMIN D2, 25-HYDROXY: <5 ng/mL
BKR VITAMIN D3, 25-HYDROXY: 42 ng/mL

## 2022-05-22 ENCOUNTER — Encounter: Admit: 2022-05-22 | Payer: PRIVATE HEALTH INSURANCE | Attending: Internal Medicine

## 2022-05-22 ENCOUNTER — Ambulatory Visit: Admit: 2022-05-22 | Payer: BLUE CROSS/BLUE SHIELD | Attending: Internal Medicine

## 2022-05-23 ENCOUNTER — Encounter: Admit: 2022-05-23 | Payer: BLUE CROSS/BLUE SHIELD

## 2022-05-29 ENCOUNTER — Telehealth: Admit: 2022-05-29 | Payer: PRIVATE HEALTH INSURANCE | Attending: Pain Medicine | Primary: Internal Medicine

## 2022-05-29 ENCOUNTER — Inpatient Hospital Stay: Admit: 2022-05-29 | Discharge: 2022-05-29 | Payer: BLUE CROSS/BLUE SHIELD | Attending: Emergency Medicine

## 2022-05-29 ENCOUNTER — Encounter: Admit: 2022-05-29 | Payer: PRIVATE HEALTH INSURANCE | Attending: Emergency Medicine | Primary: Internal Medicine

## 2022-05-29 DIAGNOSIS — S0300XA Dislocation of jaw, unspecified side, initial encounter: Secondary | ICD-10-CM

## 2022-05-29 DIAGNOSIS — E7800 Pure hypercholesterolemia, unspecified: Secondary | ICD-10-CM

## 2022-05-29 DIAGNOSIS — U071 COVID-19: Secondary | ICD-10-CM

## 2022-05-29 DIAGNOSIS — R9389 Abnormal findings on diagnostic imaging of other specified body structures: Secondary | ICD-10-CM

## 2022-05-29 DIAGNOSIS — E039 Hypothyroidism, unspecified: Secondary | ICD-10-CM

## 2022-05-29 DIAGNOSIS — R040 Epistaxis: Secondary | ICD-10-CM

## 2022-05-29 DIAGNOSIS — K5190 Ulcerative colitis, unspecified, without complications: Secondary | ICD-10-CM

## 2022-05-29 MED ORDER — SODIUM CHLORIDE 0.9 % BOLUS (NEW BAG)
0.9 % | Freq: Once | INTRAVENOUS | Status: CP
Start: 2022-05-29 — End: ?
  Administered 2022-05-30: 0.9 mL/h via INTRAVENOUS

## 2022-05-29 NOTE — Telephone Encounter
I spoke with pt regarding her upcoming appt. She is bringing an MRI on a disc and will arrive early to allow time to upload. No prior injections

## 2022-05-30 ENCOUNTER — Ambulatory Visit: Admit: 2022-05-30 | Payer: BLUE CROSS/BLUE SHIELD | Attending: Pain Medicine | Primary: Internal Medicine

## 2022-05-30 DIAGNOSIS — M549 Dorsalgia, unspecified: Secondary | ICD-10-CM

## 2022-05-30 DIAGNOSIS — M542 Cervicalgia: Secondary | ICD-10-CM

## 2022-05-30 DIAGNOSIS — M7918 Myalgia, other site: Secondary | ICD-10-CM

## 2022-05-30 DIAGNOSIS — M47812 Spondylosis without myelopathy or radiculopathy, cervical region: Secondary | ICD-10-CM

## 2022-05-30 DIAGNOSIS — M545 Chronic bilateral low back pain without sciatica: Secondary | ICD-10-CM

## 2022-05-30 LAB — URINE MICROSCOPIC     (BH GH LMW YH)
BKR HYALINE CASTS, UA INSTRUMENT (NUMERIC): 9 /LPF — ABNORMAL HIGH (ref 0–3)
BKR RBC/HPF INSTRUMENT: <1 /HPF (ref 0–2)
BKR URINE SQUAMOUS EPITHELIAL CELLS, UA (NUMERIC): 2 /HPF (ref 0–5)
BKR WBC/HPF INSTRUMENT: 4 /HPF (ref 0–5)

## 2022-05-30 LAB — BASIC METABOLIC PANEL
BKR ANION GAP: 11 (ref 7–17)
BKR BLOOD UREA NITROGEN: 15 mg/dL (ref 6–20)
BKR BUN / CREAT RATIO: 21.4 (ref 8.0–23.0)
BKR CALCIUM: 10.1 mg/dL (ref 8.8–10.2)
BKR CHLORIDE: 104 mmol/L (ref 98–107)
BKR CO2: 23 mmol/L (ref 20–30)
BKR CREATININE: 0.7 mg/dL (ref 0.40–1.30)
BKR EGFR, CREATININE (CKD-EPI 2021): >60 mL/min/{1.73_m2} (ref >=60)
BKR GLUCOSE: 101 mg/dL — ABNORMAL HIGH (ref 70–100)
BKR POTASSIUM: 4 mmol/L (ref 3.3–5.3)
BKR SODIUM: 138 mmol/L (ref 136–144)

## 2022-05-30 LAB — CBC WITH AUTO DIFFERENTIAL
BKR WAM ABSOLUTE IMMATURE GRANULOCYTES.: 0.02 x 1000/ÂµL (ref 0.00–0.30)
BKR WAM ABSOLUTE LYMPHOCYTE COUNT.: 2.13 x 1000/ÂµL (ref 0.60–3.70)
BKR WAM ABSOLUTE NRBC (2 DEC): 0 x 1000/ÂµL (ref 0.00–1.00)
BKR WAM ANALYZER ANC: 4.44 x 1000/ÂµL (ref 2.00–7.60)
BKR WAM BASOPHIL ABSOLUTE COUNT.: 0.06 x 1000/ÂµL (ref 0.00–1.00)
BKR WAM BASOPHILS: 0.8 % (ref 0.0–1.4)
BKR WAM EOSINOPHIL ABSOLUTE COUNT.: 0.16 x 1000/ÂµL (ref 0.00–1.00)
BKR WAM EOSINOPHILS: 2.2 % (ref 0.0–5.0)
BKR WAM HEMATOCRIT (2 DEC): 38.3 % (ref 35.00–45.00)
BKR WAM HEMOGLOBIN: 13 g/dL (ref 11.7–15.5)
BKR WAM IMMATURE GRANULOCYTES: 0.3 % (ref 0.0–1.0)
BKR WAM LYMPHOCYTES: 29.3 % (ref 17.0–50.0)
BKR WAM MCH (PG): 31 pg (ref 27.0–33.0)
BKR WAM MCHC: 33.9 g/dL (ref 31.0–36.0)
BKR WAM MCV: 91.4 fL (ref 80.0–100.0)
BKR WAM MONOCYTE ABSOLUTE COUNT.: 0.46 x 1000/ÂµL (ref 0.00–1.00)
BKR WAM MONOCYTES: 6.3 % (ref 4.0–12.0)
BKR WAM MPV: 9.8 fL (ref 8.0–12.0)
BKR WAM NEUTROPHILS: 61.1 % (ref 39.0–72.0)
BKR WAM NUCLEATED RED BLOOD CELLS: 0 % (ref 0.0–1.0)
BKR WAM PLATELETS: 380 x1000/ÂµL (ref 150–420)
BKR WAM RDW-CV: 12 % (ref 11.0–15.0)
BKR WAM RED BLOOD CELL COUNT.: 4.19 M/ÂµL (ref 4.00–6.00)
BKR WAM WHITE BLOOD CELL COUNT: 7.3 x1000/ÂµL (ref 4.0–11.0)

## 2022-05-30 LAB — URINALYSIS WITH CULTURE REFLEX      (BH LMW YH)
BKR BILIRUBIN, UA: NEGATIVE
BKR BLOOD, UA: NEGATIVE
BKR GLUCOSE, UA: NEGATIVE
BKR KETONES, UA: NEGATIVE
BKR NITRITE, UA: NEGATIVE
BKR PH, UA: 5.5 (ref 5.5–7.5)
BKR SPECIFIC GRAVITY, UA: 1.033 — ABNORMAL HIGH (ref 1.005–1.030)
BKR UROBILINOGEN, UA (MG/DL): <2 mg/dL (ref <=2.0)

## 2022-05-30 LAB — UA REFLEX CULTURE

## 2022-05-30 MED ORDER — CYCLOBENZAPRINE 5 MG TABLET
5 mg | ORAL_TABLET | Freq: Two times a day (BID) | ORAL | 1 refills | Status: AC | PRN
Start: 2022-05-30 — End: ?

## 2022-05-30 NOTE — Patient Instructions
5 ways to treat your pain:1. Core Strengthening atleast 4x/week2. Life style changes/Alternative medicine - Posture control, Biofeedback, Mindfulness therapy, Acupuncture, Yoga, Tai-chi, Cognitive Behavioral Therapy (against weight gain, Smoking Cessation)3. Medications4. Injections5. Surgery

## 2022-05-30 NOTE — ED Provider Notes
Chief Complaint Patient presents with ? Syncope   Pt was taking epsom salt bath 30-34mins, went to get out and syncope. Lowered to ground by husband. +dizziness. Hasn't eaten since this morning. Glucose 134. Denies CP, SOB, HA. An acute or life threatening problem was considered during this evaluation  A decision regarding hospitalization was made during this visit  Patient does not require admission or further ED Observation at this time  External data reviewed: Notes (OSH or non-ED) and LabsHistory from independent historian: Significant Other (Husband).Independent interpretation of: XRay Assessment & Plan HPI: From PS student note dated 05/29/2022: 61 yo with UC, hypothyroidism, fatty liver disease presenting after syncopal episode 1 hr ago. Pt was taking epson salt bath for 20-30 mins for achy neck. Was sitting on side of the tub waiting for husband to come with water. When she stood up, she felt dizzy and lightheaded - husband witnessed entire event and was able to bearhug her and lower her back into the tub. Husband notes she was out for up to 1 min, eyes rolled back. No tongue biting. Slowly came to with some remaining dizziness. No head strike.?Last meal around 11 AM. Went on 1.5 mile walk without symptoms today prior to this event.?No nausea, vomiting, recent illness. No CP, SOB, HA. One previous remote history of near syncope. No history of seizures. No family history of young or sudden cardiac deaths. No recent med changes. ??Social: Uses marijuana occasionally. Did not today. Minimal alcohol, no cocaine use. Lives at home w/ husband.Concern for syncope, Plan for labs where syncope is a potential life threatening problem. Plan:[id number]:SyncopeDehydrationArrhythmia2254 EKG and labs with no acute findings.   Pt feeling better after IVF.   Pt informed of all results and is stable for discharge home.   Strict return and follow up instructions given to Pt.  Please see discharge instructions.    Pt verbalizes understanding and agrees to plan.   Pt left the emergency room with a steady gait. Meds:Medications sodium chloride 0.9 % (new bag) bolus 1,000 mL (0 mLs Intravenous Stopped 05/29/22 2103)  Medication List  You have not been prescribed any medications. Discussed with Dr. Val Eagle.Richard E. Imogene Burn, PA-CPast Medical History: Diagnosis Date ? COVID-19 09/11/2019 ? Epistaxis  ? Hypercholesteremia  ? Hypothyroidism  ? Thickened endometrium 07/11/2020 ? TMJ (dislocation of temporomandibular joint)  ? Ulcerative colitis (HC Code)  Past Surgical History: Procedure Laterality Date ? COLONOSCOPY   ? LAPAROSCOPIC OVARIAN CYSTECTOMY  2006  Physical ExamED Triage VitalsBP: 131/80 [05/29/22 1947]Pulse: 78 [05/29/22 1947]Pulse from  O2 sat: 76 [05/29/22 1958]Resp: 17 [05/29/22 1947]Temp: 98.1 ?F (36.7 ?C) [05/29/22 1947]Temp src: Oral [05/29/22 1947]SpO2: 100 % [05/29/22 1947] BP 131/81  - Pulse 69  - Temp 97.9 ?F (36.6 ?C) (Oral)  - Resp 18  - SpO2 100% Physical ExamVitals and nursing note reviewed. Constitutional:     General: She is not in acute distress.   Appearance: She is well-developed. HENT:    Head: Normocephalic and atraumatic. Eyes:    Pupils: Pupils are equal, round, and reactive to light. Cardiovascular:    Rate and Rhythm: Normal rate and regular rhythm.    Heart sounds: Normal heart sounds. Pulmonary:    Effort: Pulmonary effort is normal.    Breath sounds: Normal breath sounds. Abdominal:    Palpations: Abdomen is soft.    Tenderness: There is no abdominal tenderness. Musculoskeletal:       General: Normal range of motion.    Cervical back: Normal range of motion. Skin:  General: Skin is warm and dry. Neurological:    Mental Status: She is alert and oriented to person, place, and time.    GCS: GCS eye subscore is 4. GCS verbal subscore is 5. GCS motor subscore is 6. Psychiatric:       Attention and Perception: Attention and perception normal.       Mood and Affect: Mood and affect normal.       Speech: Speech normal.       Behavior: Behavior normal.       Thought Content: Thought content normal.       Cognition and Memory: Cognition and memory normal.       Judgment: Judgment normal.  ProceduresAttestation/Critical CarePatient Reevaluation: ED Attestation: PA/APRNFace to face evaluation was performed by me in collaboration with the Advanced Practice Provider to assess for significant health threats. I provided a substantive portion of the care of this patient.? I personally performed medically appropriate history and physical exam and the MDM: 61 yo with UC, hypothyroidism, fatty liver disease presenting with syncope. Poor PO throughout day. Warm bath this evening, then syncope with standing. Husband hugged and no component of trauma. No witnessed sz activity. No current CP/SOB; feels at baseline. VSS. Exam reassuring, no lateral tongue biting. No e/o trauma. MAEE, NVI. Concern for orthostatic hypotension, vasovagal syncope, dehydration, plan for labs, EKG, IVF. Less likely PE, ACS.I am reachable on [facility], please call me with any questions.Lorrine Kin, MDAttending Physician, Emergency MedicineClinical Impressions as of 05/30/22 0058 Syncope, unspecified syncope type  ED DispositionDischarge Lorrine Kin, MD08/15/23 2217 Arnoldo Lenis, PA08/15/23 2255 Arnoldo Lenis, PA08/15/23 2256 Lorrine Kin, MD08/16/23 [phone]

## 2022-05-30 NOTE — Progress Notes
[facility] [address], [facility], [place] Appointment scheduling: [phone]) [phone] , Fax: [phone] Today's Date: 8/16/2023Patient name: Lori Colon-ShermanMRN: [id number] Referring Provider: Adonis Brook, MDNew Patient Impression & Plan: IMPRESSION:61 y.o. 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 Neck pain since 2019-likely facetogenic/myofascial pain.  2. Polyarthralgia/polymyalgia since 2019-unclear etiology. No obvious evidence of inflammatory arthropathy. Likely underlying fibromyalgia.Pertinent treatments thus far: Meds (Cymbalta, Flexeril)PLAN:Diagnostics: Follow-up for DEXA (06/07/2022). No further imaging needed at this time. Went over new imaging in detail with patient. However if no improvement would consider EMG BUE.  MRI C-spine images to be uploaded from [facility]): None at this time. Educated red flags.  Balance of weight loss (Ozempic). Consider rheumatology referral in future. Therapy: Ordered aqua therapy ([facility])/Continue PT ([facility], for groin pain) focusing on neck/core ROM, stretching and strengthening, and education on home exercise program. Stressed the importance of continuing home exercise program at least 4 days out of the week. Discussed life style changes and postural modifications in detail. Alternative medicine/complimentary modalities to be considered in the future-acupuncture. Ok to try traction with PT in future. Meds: Patient on gabapentin 600 mg qhs (since 2019), recreational Marijuana (gummies), Ozempic. Ordered Flexeril 5-10 bid p.r.n. Future consideration:  Lyrica versus weaning gabapentin. Discussed side effect profile for all medications and patient expressed understanding. Pertinent facts: As below. Failed meds: Cymbalta/Flexeril/Tylenol/amitriptyline Injections: None at this time. Future consideration: cervical paraspinals (no steroid ) trigger point vs MBB/RFA. Discussed in detail risks and benefits of the procedure and patient expressed understanding. Pertinent facts: Ulcerative colitis. Osteopenia (DEXA 2020). Fatty liver (ALT37 05/2022).P&O: None at this time.  Recommended cervical traction with PTFollow up: Return in about 6 months (around 11/30/2022), or if symptoms worsen or fail to improve.  Aqua therapy trial. Going to [place] - 10/2023Thank you Dr. Tasia Catchings for allowing me to care for this patient.                                                                                                                                                      History of Present Illness CC:  Neck painHPI: Patient complains of pain in the neck. Location: neck and upper traps. 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 Other pertinent hx: Whole body pain started in 2019, started with hand, elbow, was working as Media planner in 2005 had weak muscles - due to lots of typing so stopped working as Media planner for 6 months. Moved  to [place] from [place]. When she was in [place] has two deaths in 1 weeks - so was stressed, was in hotel  Mother got sick so was working 1-2/wk,. 2022 - started trainers - tried different types of exercises - no help so stopped. 2023 - went to help one aunt, want not able to follow-up with doctors, was doing well. No pain if not doing heavy work - cleaning bathtub, extensive cleaning bothersome. Secondary problem: Pain upper back and bilateral shouldersOnset:  2019Related trauma: NoneNumbness/Tingling: None3. Bilateral elbows4. Bilateral wrist and hands5. Left knee6. Bilateral ankles7. Bilateral groin radiating into pelvic area8. Lower back painHas pain in muscles and joints. No pain in the chest/abdomen in the past 6 months. Current Pain Meds: gabapentin 600 mg qhs (since 2019) - was taking amitriptyline before then switched to gabapentinRecreational Marijuana (gummies)Past Pain Meds: Cymbalta - sedationFlexeril - helps with sleepingTylenol - no helpMotrin - cannot take due to ulcerative colitisOther medsOzempicSynthroidInjections in the past:NonePhysical Therapy -  last session: PT ([facility]) 2022. Started going to PT again for groin pain. HEP - limited. Chiropractor - NoneAcupuncture - NonePertinent Surgeries: NoneB/B issues: no urinary issues; constipation/diarrhea, no bowel issuesFevers:  noneUnintentional weight loss: Intentional weight loss 15 lbs in last 6 months (Ozempic)  Depression/Anxiety:   noneCP/SOB:  noneWeakness:  noneOther symptoms: None.  Social Hx: Denies smoking / drinking. Recreational marijuana (gummies). Left handed. Married. Lives with mother. No other family in [place]. Ethnicity - [place]. Mother passed away. 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 Family history:  as below. 1 Half brother - No spine issues. Other pertinent hx: No diabetes/cancer.Some imaging. 05/2022 pert labs WNL except ALT37 05/2022. No A1c. FU gastro, PCP, ED (08/15 syncope)Discussed importance of core/muscle strengthening at least 4x/wk. Discussed importance of lifestyle/posture modifications. Discussed against gaining weight. Discussed risks and benefits of medications, injections, and surgery to patient; she expressed understanding. No need for surgery. She is interested in aqua therapy, going to [place] in 07/2022. Recommended traction for never pinching in the hands. She is wondering if her symptoms can be due to menopause. Advised to follow-up with obgyn about it. Call of injections/meds anytime. Medical, Family, Social History: Past Medical History: Diagnosis Date ? COVID-19 09/11/2019 ? Epistaxis  ? Hypercholesteremia  ? Hypothyroidism  ? Thickened endometrium 07/11/2020 ? TMJ (dislocation of temporomandibular joint)  ? Ulcerative colitis (HC Code)  Past Surgical History: Procedure Laterality Date ? COLONOSCOPY   ? LAPAROSCOPIC OVARIAN CYSTECTOMY  2006 Family History Problem Relation Age of Onset ? Uterine cancer Mother  ? Breast cancer Neg Hx  ? Colon cancer Neg Hx  ? Ovarian cancer Neg Hx  Social History Occupational History ? Not on file Tobacco Use ? Smoking status: Former   Current packs/day: 0.00 ? Smokeless tobacco: Never ? Tobacco comments:   quit about 7 years ago Vaping Use ? Vaping Use: Never used Substance and Sexual Activity ? Alcohol use: Yes   Alcohol/week: 4.0 standard drinks of alcohol   Types: 2 Glasses of wine, 2 Cans of beer per week   Comment: occ ? Drug use: Yes   Comment: gummy ? Sexual activity: Not on file Social History Substance and Sexual Activity Drug Use Yes  Comment: gummy Allergies: Allergies Allergen Reactions ? Methyldibromoglutaronitrile  ? Oleamidopropyl Dimethylamine  Outpatient Encounter Medications as of 05/30/2022 Medication Sig Dispense Refill ? azelaic acid (FINACEA) 15 % gel    ? azelastine-fluticasone (DYMISTA) 137-50 mcg/spray nasal spray USE 1  SPRAY IN BOTH  NOSTRILS TWICE DAILY 69 g 5 ? cholecalciferol, vitamin D3, 25 mcg (1,000 unit) capsule Take 3 capsules (3,000 Units total) by mouth daily.   ? crisaborole (EUCRISA) 2 % Oint Apply topically to the affected areas on the arms and face as needed for maintenance. 60 g 2 ? estradioL (VAGIFEM) 10 mcg vaginal tablet PLACE 1 INSERT VAGINALLY TWICE  WEEKLY 24 tablet 0 ? gabapentin (NEURONTIN) 300 mg capsule TAKE 2 CAPSULES BY MOUTH 3  TIMES DAILY WITH MEALS (Patient taking differently: 600mg  daily) 540 capsule 3 ? LIALDA 1.2 gram delayed release tablet TAKE 4 TABLETS BY MOUTH  ONCE DAILY 360 tablet 3 ? mesalamine (CANASA) 1000 mg suppository Place 1 suppository (1,000 mg total) rectally nightly. 90 suppository 0 ? mesalamine (LIALDA) 1.2 gram delayed release tablet Take 4 tablets (4.8 g total) by mouth daily. 360 tablet 11 ? OZEMPIC 2 mg/dose (8 mg/3 mL) pen injector INJECT SUBCUTANEOUSLY 2 MG EVERY WEEK 9 mL 3 ? rosuvastatin (CRESTOR) 5 mg tablet TAKE 1 TABLET BY MOUTH  DAILY 90 tablet 3 ? SYNTHROID 50 mcg tablet TAKE 1 TABLET BY MOUTH  DAILY 90 tablet 3 ? clobetasoL (TEMOVATE) 0.05 % external solution  (Patient not taking: Reported on 05/22/2022)   ? cyclobenzaprine (FLEXERIL) 5 mg tablet Take 1-2 tablets (5-10 mg total) by mouth 2 (two) times daily as needed (Pain). 40 tablet 0 No facility-administered encounter medications on file as of 05/30/2022. The above PMH/PSH/Meds/allergies/SH/FH was reviewed. REVIEW OF SYSTEMSConstitutional: Per HPIHEENT:  Per HPIRespiratory:  For HPICardiovascular:  For HPIGastrointestinal: Per HPIGenitourinary:  Per HPIMusculoskeletal: Per HPINeurologic: Per HPIPsychiatric: Per HPISkin:  For HPIPhysical Exam: Vital Signs: Ht 5' 1 (1.549 m)  - Wt 64.9 kg  - BMI 27.02 kg/m? General Appearance: NAD Psychiatric: appropriate mood & affectHEENT: NC/ATCardiovascular:  Normal peripheral pulses; no edema.Respiratory:  Normal rate; unlabored breathing.Skin:  Intact; no erythema/rash.MSK: No joint swelling, rest as belowCERVICAL: Inspection: No atrophyROM: Slight decreased ROM restricted in rotation.  Palpatory exam: (+) Slight tenderness in bilateral cervical paraspinals. Provocative tests: (-)Spurling's maneuver SHOULDER: BilateralInspection: No scapular winging. No atrophy. ROM: Active abduction: 180? Active internal rotation: R T4      -  L T6Passive external rotation (while arms adducted): 90? Palpatory exam: (-) Tenderness Provacative tests:(-)Kennedy-Hawkins(-)Empty can (-)Scarf(-)Yergensons(-)Obrien?sNTApprehension test NEURO:Gait: Normal Toe walk: NormalHeel walk: NormalHeel to toe tandem walk: Normal Tinnel's BUE: Negative. Straight Phalen's test: Negative. Motor  Strength: Right  Strength: Left  Upper Extremity   Deltoid  5/5  5/5  Biceps  5/5  5/5  Triceps  5/5  5/5  Wrist Extensors  5/5  5/5  Finger abductors 5/5 5/5 Lower Extremity    Iliospoas  5/5  5/5  Quadriceps  5/5  5/5  Tibialis anterior  5/5  5/5  EHL 5/5 5/5 Gastroc soleus  5/5 5/5 Gluteus medius  5/5  5/5  Sensation: Intact in all dermatomes BUE  BLEDTRs:Reflex Right Left Biceps 1+ 1+ Triceps trace trace Brachioradialis 1+ 1+ Patella 2+ 2+ Achilles  1+ 1+ Babinski   Clonus Negative Negative Hoffmann Negative Negative LABS:Lab Results Component Value Date  WBC 7.3 05/29/2022  HGB 13.0 05/29/2022  HCT 38.30 05/29/2022  MCV 91.4 05/29/2022  PLT 380 05/29/2022   Chemistry  Lab Results Component Value Date  NA 138 05/29/2022  K 4.0 05/29/2022  CL 104 05/29/2022  CO2 23 05/29/2022  BUN 15 05/29/2022  CREATININE 0.70 05/29/2022  GLU 101 (H) 05/29/2022  Lab Results Component Value Date  CALCIUM 10.1 05/29/2022  ALKPHOS 74 05/15/2022  AST 25 05/15/2022  ALT 37 (H) 05/15/2022  BILITOT 0.5 05/15/2022  No results found for: HGBA1CIMAGING:Pertinent new studies interpreted by me and reviewed with patient. Summary stated in the impression section. DEXA (08/2019)IMPRESSION:Osteopenia of lumbar spine and bilateral femoral necks that is associated with increased risk of fracture.Normal bone density of bilateral total hips.FRAX (10-year Fracture Risk): Major Osteoporotic Fracture: 4.7%Hip Fracture: 0.5%Based on right hip measurements.MRI C-spine (05/2021)XR C-spine (03/2021)Findings:Cervical spine is visualized from C1 through C7. No acute compression fracture or spondylolisthesis. Multilevel degenerative changes of the cervical spine most pronounced at C5-C6. Prevertebral soft tissues are unremarkable. There is no high-grade neural foraminal narrowing.Marland Kitchen?Impression:Cervical spondylosis, most pronounced at C5-C6.Diagnoses Codes for this visit:  ICD-10-CM  1. Myofascial muscle pain  M79.18 Ambulatory referral to Rehab Services  2. Cervical spondylosis  M47.812 Ambulatory referral to Rehab Services  3. Neck pain  M54.2 Ambulatory referral to Rehab Services  4. Chronic bilateral low back pain without sciatica  M54.50   G89.29   5. Spine pain, multilevel  M54.9   Education: discussed her condition, diagnoses, prognosis, further work up and treatment options. Took the time to answer her questions pertaining to conservative and interventional treatment options.  Explained red flag symptoms for her condition.Follow up: Return sooner if needed, also advised to call with any questions or concerns in the interim.On the day of this patient's encounter, a total of 45 minutes was personally spent by me.  This does not include any resident/fellow teaching time, or any time spent performing a procedural service.This note is produced by voice recognition software. Please excuse typographical and grammatical errors I may have unintentionally missed during my review.Scribed for Weston Brass, DO by Lenox Ahr, medical scribe May 30, 2022  The documentation recorded by the scribe accurately reflects the services I personally performed and the decisions made by me. I reviewed and confirmed all material entered and/or pre-charted by the scribe. Impression & Plan: Listed Harley Alto, DOInterventional Spine and Musculoskeletal MedicinePhysical Medicine and RehabilitationYale Department of Orthopedics and Rehabilitation

## 2022-05-30 NOTE — ED Notes
8:01 PM 61 yo female BIBA s/p syncopal episode. Per EMS report, pt was taking a bath w/ asperol salts for about 20 minutes when she began to get thirsty. Pt called her husband to bring her a glass of water, and when she stood up to drink the water, her eyes rolled back and she went unconscious for about a minute. Husband lowered patient to the ground. - head strike. - thinners. Pt denies any significant past medical history, bu did report she has been dizzy on a few instances recently. Also states she only drank and ate a little bit of food earlier today. Denies any pain, injuries/trauma, N/V/D, fevers/chills, lightheadedness at this time.  Aox4, GCS 15. Well appearing, in NAD. RRs even and unlabored. Speaking in full clear sentences w/ clear and coherent speech. Answering questions appropriately. Placed on full cardiac monitoring. VSS. PT resting comfortably in stretcher, locked in lowest position, side rails raised x 2, call bell within reach. Husband and MD at bedside for assessment. 8:20 PM PIV established. + blood return. Labs collected and sent. Fluid bolus initiated. EKG completed by EDTA. 9:03 PM Fluid bolus complete. Pt ambulatory to BR w/ steady gait. 9:14 PM Urine sample collected and sent. 11:19 PM PT discharged by provider. VSS, in NAD, AAOx( 4), PIV pulled prior to dispo. PT verbalized understanding of AVS and return to ED precaution teaching and denies further questions at this time. All belongings w/ pt. PT ambulated out of ED with steady gait.

## 2022-05-31 ENCOUNTER — Encounter: Admit: 2022-05-31 | Payer: BLUE CROSS/BLUE SHIELD | Attending: Internal Medicine

## 2022-05-31 LAB — URINE CULTURE

## 2022-06-04 NOTE — Other
Chief Complaint Patient presents with ? Syncope   Pt was taking epsom salt bath 30-20mins, went to get out and syncope. Lowered to ground by husband. +dizziness. Hasn't eaten since this morning. Glucose 134. Denies CP, SOB, HA. HPI/PE:61 yo with UC, hypothyroidism, fatty liver disease presenting after syncopal episode 1 hr ago. Pt was taking epson salt bath for 20-30 mins for achy neck. Was sitting on side of the tub waiting for husband to come with water. When she stood up, she felt dizzy and lightheaded - husband witnessed entire event and was able to bearhug her and lower her back into the tub. Husband notes she was out for up to 1 min, eyes rolled back. No tongue biting. Slowly came to with some remaining dizziness. No head strike.Last meal around 11 AM. Went on 1.5 mile walk without symptoms today prior to this event.No nausea, vomiting, recent illness. No CP, SOB, HA. One previous remote history of near syncope. No history of seizures. No family history of young or sudden cardiac deaths. No recent med changes. Social: Uses marijuana occasionally. Did not today. Minimal alcohol, no cocaine use. Lives at home w/ husband. ZOX:WRUEA for syncope consider metabolic vs cardiac vs vasovagal in the setting of potential dehydration. Consider UTI. Low concern for seizure.Plan: CBC, BMP, U/A, ECG. Thyroid testing recently done 08/01. Witnessed without head strike, does not warrant head imaging. 1L fluids.ED COURSE:CBC, BMP unremarkableU/A unremarkable - hyaline casts likely dirty sample.ECG sinus rhythmReevaluation: Unlikely infection given work up (no white count, neg U/A). History, exam and ECG not consistent with arrhythmic origin of syncope. Given history of decreased PO intake during the day, sitting in warm bath most supportive of vasovagal iso dehydration. Feels much improved with IVF. Ambulating with steady gait. Will discharge with outpatient PCP follow up. External data reviewed: Labs  Physical ExamED Triage Vitals [05/29/22 1947]BP: 131/80Pulse: 78Pulse from  O2 sat: n/aResp: 17Temp: 98.1 ?F (36.7 ?C)Temp src: OralSpO2: 100 % BP 131/81  - Pulse 69  - Temp 97.9 ?F (36.6 ?C) (Oral)  - Resp 18  - SpO2 100% Physical ExamConstitutional:     General: She is not in acute distress.   Appearance: Normal appearance. She is normal weight. HENT:    Head: Normocephalic and atraumatic. Eyes:    Extraocular Movements: Extraocular movements intact.    Conjunctiva/sclera: Conjunctivae normal.    Pupils: Pupils are equal, round, and reactive to light. Cardiovascular:    Rate and Rhythm: Normal rate and regular rhythm.    Pulses: Normal pulses.    Heart sounds: Normal heart sounds. Pulmonary:    Effort: Pulmonary effort is normal. No respiratory distress.    Breath sounds: Normal breath sounds. Musculoskeletal:    Cervical back: Normal range of motion and neck supple. Skin:   General: Skin is warm and dry. Neurological:    General: No focal deficit present.    Mental Status: She is alert and oriented to person, place, and time.    Cranial Nerves: No cranial nerve deficit.    Motor: No weakness.  ProceduresAttestation/Critical CareClinical Impressions as of 06/04/22 1151 Syncope, unspecified syncope type  ED DispositionDischarge

## 2022-06-05 ENCOUNTER — Inpatient Hospital Stay: Admit: 2022-06-05 | Discharge: 2022-06-05 | Payer: BLUE CROSS/BLUE SHIELD | Primary: Internal Medicine

## 2022-06-05 DIAGNOSIS — R55 Syncope and collapse: Secondary | ICD-10-CM

## 2022-06-07 ENCOUNTER — Inpatient Hospital Stay: Admit: 2022-06-07 | Discharge: 2022-06-07 | Payer: BLUE CROSS/BLUE SHIELD | Primary: Internal Medicine

## 2022-06-07 DIAGNOSIS — Z780 Asymptomatic menopausal state: Secondary | ICD-10-CM

## 2022-06-07 DIAGNOSIS — M8580 Other specified disorders of bone density and structure, unspecified site: Secondary | ICD-10-CM

## 2022-06-12 ENCOUNTER — Telehealth: Admit: 2022-06-12 | Payer: PRIVATE HEALTH INSURANCE | Attending: Internal Medicine

## 2022-06-12 MED ORDER — GABAPENTIN 300 MG CAPSULE
300 | ORAL_CAPSULE | Freq: Three times a day (TID) | ORAL | 5 refills | 30.00000 days | Status: AC
Start: 2022-06-12 — End: ?

## 2022-06-14 ENCOUNTER — Encounter: Admit: 2022-06-14 | Payer: PRIVATE HEALTH INSURANCE | Primary: Internal Medicine

## 2022-06-27 ENCOUNTER — Encounter: Admit: 2022-06-27 | Payer: PRIVATE HEALTH INSURANCE

## 2022-06-27 ENCOUNTER — Ambulatory Visit: Admit: 2022-06-27 | Payer: BLUE CROSS/BLUE SHIELD

## 2022-06-27 MED ORDER — ESTRADIOL 10 MCG VAGINAL TABLET
10 | ORAL_TABLET | VAGINAL | 5 refills | 63.00000 days | Status: AC
Start: 2022-06-27 — End: 2022-10-23

## 2022-07-02 ENCOUNTER — Telehealth: Admit: 2022-07-02 | Payer: PRIVATE HEALTH INSURANCE | Attending: Internal Medicine

## 2022-07-02 ENCOUNTER — Telehealth: Admit: 2022-07-02 | Payer: PRIVATE HEALTH INSURANCE | Attending: Cardiovascular Disease | Primary: Internal Medicine

## 2022-07-02 ENCOUNTER — Telehealth: Admit: 2022-07-02 | Payer: PRIVATE HEALTH INSURANCE | Attending: Internal Medicine | Primary: Internal Medicine

## 2022-07-02 NOTE — Telephone Encounter
Received fax with two serious events:SVT (1 min) at 8:00am central time on 07-02-22.  HR 166SVT (>60 sec) at 8:03am central time on 07-02-22.  HR 161 - 164I have given to [place].

## 2022-07-02 NOTE — Telephone Encounter
Received a fax with three serious events:Sinus rhythm, atrial fibrillation RVR non sustained at 2-29am central time on 07-02-22.  HR 95 - 140SVT (>26 secs) at 2:40am central time on 07-02-22.  HR 96 - 179SVT (>60 secs) at 5:07am central time on 07-02-22.  HR 163 - 164I have given to triage nurses.

## 2022-07-02 NOTE — Telephone Encounter
Received 2 additional preventice alerts occurring today, 9/18 @ 8:03 am and 8:00 am central time. (Pt spoke to Dr Tasia Catchings this morning after these events)Both strips SVT- rate of 166 and 164 in the setting of URI symptoms. Pt continues to wear monitor for syncope work-up- day 28 of 30 day monitor.Reviewed by Dr Griffin Basil and scanned in pt's chart.

## 2022-07-02 NOTE — Telephone Encounter
Received preventice alerts- pt is day 28 of 30 monitor 3  Events occurring all on 9/18, @ 2:29 am central time-  Afib with RVR rate of 140 followed by SR 95 event  @2 :40 am central- Sinus tachy with PAC's vs SVT w/ PAC's rate of 179 followed by SR event @ 5:07 am  Central time- SVT HR 164 Spoke to pt - reports cold symptoms, took tylenol last night, feeling head and sinus congestion, tonsils inflamed, general weakness.Pt reports at around 6 am, she did get up to the bathroom and also took her synthroid.Reports a weird feeling, felt like her heart was racing fast lasting a few seconds.Pt reports sleeping through prior episodes.Alerts reviewed and per Dr Griffin Basil- brief episodes of PAF- see scanned strips scanned in media.

## 2022-07-03 ENCOUNTER — Encounter: Admit: 2022-07-03 | Payer: PRIVATE HEALTH INSURANCE

## 2022-07-03 MED ORDER — SYNTHROID 50 MCG TABLET
50 | ORAL_TABLET | ORAL | 4 refills | 90.00000 days | Status: AC
Start: 2022-07-03 — End: ?

## 2022-07-04 ENCOUNTER — Telehealth: Admit: 2022-07-04 | Payer: PRIVATE HEALTH INSURANCE | Attending: Internal Medicine

## 2022-07-05 ENCOUNTER — Encounter: Admit: 2022-07-05 | Payer: PRIVATE HEALTH INSURANCE | Primary: Internal Medicine

## 2022-07-05 DIAGNOSIS — N342 Other urethritis: Secondary | ICD-10-CM

## 2022-07-09 ENCOUNTER — Telehealth: Admit: 2022-07-09 | Payer: PRIVATE HEALTH INSURANCE | Attending: Internal Medicine | Primary: Internal Medicine

## 2022-07-09 NOTE — Telephone Encounter
Please see encounter from 9/18- strip  received occurred  @2 :29 am  central time on 9/18 and addressed already and determined to be afib- reviewed by Dr Griffin Basil.

## 2022-07-09 NOTE — Telephone Encounter
Received a correction fax with a serious event:Sinus rhythm with PSVT (24 sec) at 2:28am central time on 07-02-22.  HR 95 - 140Correction version - atrial fibrillation determined to be PSVT (24 sec)I have given to triage nurses.

## 2022-07-20 ENCOUNTER — Inpatient Hospital Stay: Admit: 2022-07-20 | Discharge: 2022-07-20 | Payer: BLUE CROSS/BLUE SHIELD | Primary: Internal Medicine

## 2022-07-20 DIAGNOSIS — R58 Hemorrhage, not elsewhere classified: Secondary | ICD-10-CM

## 2022-07-25 ENCOUNTER — Encounter: Admit: 2022-07-25 | Payer: PRIVATE HEALTH INSURANCE | Attending: Cardiovascular Disease | Primary: Internal Medicine

## 2022-07-25 ENCOUNTER — Encounter: Admit: 2022-07-25 | Payer: PRIVATE HEALTH INSURANCE

## 2022-07-25 ENCOUNTER — Ambulatory Visit: Admit: 2022-07-25 | Payer: BLUE CROSS/BLUE SHIELD | Attending: Cardiovascular Disease | Primary: Internal Medicine

## 2022-07-25 ENCOUNTER — Encounter: Admit: 2022-07-25 | Payer: BLUE CROSS/BLUE SHIELD

## 2022-07-25 DIAGNOSIS — E7800 Pure hypercholesterolemia, unspecified: Secondary | ICD-10-CM

## 2022-07-25 DIAGNOSIS — K5190 Ulcerative colitis, unspecified, without complications: Secondary | ICD-10-CM

## 2022-07-25 DIAGNOSIS — S0300XA Dislocation of jaw, unspecified side, initial encounter: Secondary | ICD-10-CM

## 2022-07-25 DIAGNOSIS — I498 Other specified cardiac arrhythmias: Secondary | ICD-10-CM

## 2022-07-25 DIAGNOSIS — E785 Hyperlipidemia, unspecified: Secondary | ICD-10-CM

## 2022-07-25 DIAGNOSIS — R040 Epistaxis: Secondary | ICD-10-CM

## 2022-07-25 DIAGNOSIS — E039 Hypothyroidism, unspecified: Secondary | ICD-10-CM

## 2022-07-25 DIAGNOSIS — R9389 Abnormal findings on diagnostic imaging of other specified body structures: Secondary | ICD-10-CM

## 2022-07-25 DIAGNOSIS — U071 COVID-19: Secondary | ICD-10-CM

## 2022-07-26 ENCOUNTER — Telehealth: Admit: 2022-07-26 | Payer: PRIVATE HEALTH INSURANCE | Attending: Internal Medicine

## 2022-07-27 ENCOUNTER — Encounter: Admit: 2022-07-27 | Payer: PRIVATE HEALTH INSURANCE | Attending: Internal Medicine

## 2022-08-08 ENCOUNTER — Telehealth: Admit: 2022-08-08 | Payer: PRIVATE HEALTH INSURANCE | Attending: Internal Medicine

## 2022-08-08 ENCOUNTER — Ambulatory Visit: Admit: 2022-08-08 | Payer: BLUE CROSS/BLUE SHIELD | Primary: Internal Medicine

## 2022-08-08 DIAGNOSIS — I498 Other specified cardiac arrhythmias: Secondary | ICD-10-CM

## 2022-08-08 MED ORDER — OZEMPIC 1 MG/DOSE (4 MG/3 ML) SUBCUTANEOUS PEN INJECTOR
1 | SUBCUTANEOUS | 1 refills | 28.00000 days | Status: AC
Start: 2022-08-08 — End: 2022-08-10

## 2022-08-10 ENCOUNTER — Encounter: Admit: 2022-08-10 | Payer: PRIVATE HEALTH INSURANCE

## 2022-08-10 ENCOUNTER — Telehealth: Admit: 2022-08-10 | Payer: PRIVATE HEALTH INSURANCE | Attending: Internal Medicine

## 2022-08-10 MED ORDER — OZEMPIC 1 MG/DOSE (4 MG/3 ML) SUBCUTANEOUS PEN INJECTOR
1 | SUBCUTANEOUS | 1 refills | 28.00000 days | Status: AC
Start: 2022-08-10 — End: 2022-08-15

## 2022-08-13 ENCOUNTER — Inpatient Hospital Stay: Admit: 2022-08-13 | Discharge: 2022-08-13 | Payer: BLUE CROSS/BLUE SHIELD | Primary: Internal Medicine

## 2022-08-13 DIAGNOSIS — I498 Other specified cardiac arrhythmias: Secondary | ICD-10-CM

## 2022-08-15 ENCOUNTER — Telehealth: Admit: 2022-08-15 | Payer: PRIVATE HEALTH INSURANCE | Attending: Internal Medicine

## 2022-08-15 ENCOUNTER — Ambulatory Visit
Admit: 2022-08-15 | Payer: BLUE CROSS/BLUE SHIELD | Attending: Student in an Organized Health Care Education/Training Program | Primary: Internal Medicine

## 2022-08-15 ENCOUNTER — Encounter
Admit: 2022-08-15 | Payer: PRIVATE HEALTH INSURANCE | Attending: Student in an Organized Health Care Education/Training Program | Primary: Internal Medicine

## 2022-08-15 DIAGNOSIS — E039 Hypothyroidism, unspecified: Secondary | ICD-10-CM

## 2022-08-15 DIAGNOSIS — N399 Disorder of urinary system, unspecified: Secondary | ICD-10-CM

## 2022-08-15 DIAGNOSIS — N368 Other specified disorders of urethra: Secondary | ICD-10-CM

## 2022-08-15 DIAGNOSIS — S0300XA Dislocation of jaw, unspecified side, initial encounter: Secondary | ICD-10-CM

## 2022-08-15 DIAGNOSIS — R9389 Abnormal findings on diagnostic imaging of other specified body structures: Secondary | ICD-10-CM

## 2022-08-15 DIAGNOSIS — K5190 Ulcerative colitis, unspecified, without complications: Secondary | ICD-10-CM

## 2022-08-15 DIAGNOSIS — E7800 Pure hypercholesterolemia, unspecified: Secondary | ICD-10-CM

## 2022-08-15 DIAGNOSIS — R040 Epistaxis: Secondary | ICD-10-CM

## 2022-08-15 DIAGNOSIS — U071 COVID-19: Secondary | ICD-10-CM

## 2022-08-15 MED ORDER — ESTRADIOL 0.01% (0.1 MG/GRAM) VAGINAL CREAM
0.01 % | VAGINAL | 3 refills | Status: AC
Start: 2022-08-15 — End: ?

## 2022-08-15 MED ORDER — METRONIDAZOLE 0.75 % TOPICAL CREAM
0.75 % | Status: AC
Start: 2022-08-15 — End: ?

## 2022-08-15 MED ORDER — OZEMPIC 1 MG/DOSE (4 MG/3 ML) SUBCUTANEOUS PEN INJECTOR
1 | SUBCUTANEOUS | 3 refills | 28.00000 days | Status: AC
Start: 2022-08-15 — End: ?

## 2022-08-16 DIAGNOSIS — R58 Hemorrhage, not elsewhere classified: Secondary | ICD-10-CM

## 2022-08-16 DIAGNOSIS — N342 Other urethritis: Secondary | ICD-10-CM

## 2022-08-20 ENCOUNTER — Telehealth: Admit: 2022-08-20 | Payer: PRIVATE HEALTH INSURANCE | Attending: Internal Medicine

## 2022-08-22 ENCOUNTER — Encounter: Admit: 2022-08-22 | Payer: PRIVATE HEALTH INSURANCE

## 2022-08-22 NOTE — Progress Notes
[facility]	Urology History and PhysicalHPI: Lori Mckinney is a 61 y.o. female who presents with urethral irritation Patient has followed with Dr. Leander Rams (gynecology) for blood on her underwear. It was unclear if it was gyn or GU origin. She states that she only has blood on her underwear or toilet paper and denies having overt gross hematuria. Most recently she had a transvaginal US on 07/20/2022 for evaluation of this.  It revealed a normal endometrial thickness of 0.2 cm. She has had a history of vaginal bleeding. Note that she previously had an endometrial biopsy in 2021 which was benign.She notes that she takes oral estradiol, but has not tried topical cream previously. She notes that she also has some discomfort when initiating intercourse. Patient notes that she occasionally has to strain during her bowel movements. Denies history of kidney or bladder stones. She also denies a family history of kidney or bladder cancer.  Post Void Bladder Scan Measurement Date Value Ref Range Status 08/15/2022 0 mL Final ROS: A 10 system ROS is negative except per HPIPMH:  has a past medical history of COVID-19 (09/11/2019), Epistaxis, Hypercholesteremia, Hypothyroidism, Thickened endometrium (07/11/2020), TMJ (dislocation of temporomandibular joint), and Ulcerative colitis (HC Code).PSH:  has a past surgical history that includes Laparoscopic ovarian cystectomy (2006) and Colonoscopy.Allergies: is allergic to methyldibromoglutaronitrile and oleamidopropyl dimethylamine.FH: family history includes Uterine cancer in her mother.SH:  reports that she has quit smoking. She has never used smokeless tobacco. She reports current alcohol use of about 4.0 standard drinks of alcohol per week. She reports current drug use.Medications: azelaic acid, azelastine-fluticasone, cholecalciferol (vitamin D3), clobetasoL, crisaborole, cyclobenzaprine, estradioL, gabapentin, levothyroxine, mesalamine, metroNIDAZOLE, rosuvastatin, and semaglutideVitals: Exam:Gen: Alert, oriented, NAD CV: RR Pulm: Non-labored breathing Abd: soft, NT/ND Ext: WWP GU: Normal external genitalia. Normal meatus. No pelvic organ prolapse. Vulva normal, urethra with mild prolapse, circumferential Patient exam or treatment required medical chaperone.The sensitive parts of the examination were performed with chaperone present: Yes; Chaperone Name, Role/Title: Alyssa, RN Labs: Lab Results Component Value Date  UCOLOR yellow 08/15/2022  UGLUCOSE Negative 08/15/2022  UKETONE Negative 08/15/2022  USPECGRAVITY 1.025 08/15/2022  UPROTEIN Negative 08/15/2022  UNITRATES Negative 08/15/2022  UBLOOD Negative 08/15/2022  ULEUKOCYTES Negative 08/15/2022 Lab Results Component Value Date  SPECGRAV 1.033 (H) 05/29/2022  PHUR 5.5 05/29/2022  KETONESU Negative 05/29/2022  PROTEINUA 1+ (A) 05/29/2022  BLOODU Negative 05/29/2022  LEUKOCYTESUR 2+ (A) 05/29/2022  NITRITE Negative 05/29/2022  BACTERIA Rare 05/29/2022 Microbiology: 05/29/2022 Urine Culture, Routine Less than 10,000 CFU/mL. Clinical significance is unlikely for organism(s) present in quantities of less than 10,000 CFU/mL.  Pathology:Endometrium Biopsy - 07/11/2020- Atrophic endometrium- Fragments of benign endocervical tissue and benign squamous epithelium Radiology:US Non-OB Transvaginal - 10/06/2023Impression:- Endometrial thickness measures 0.2 cm.- Calcified fundal uterine fibroid again noted.ASSESSMENT/PLAN:Lori Mckinney is a 61 y.o. female who presents with urethral irritation Urethral prolapse- We discussed the etiology of urethral prolapse- Blood on the toilet paper and underwear is most likely related to this. - Vaginal estrogen periurethral recommended twice a week for the next few months- Advised the patient to use lubrication during intercourse to reduce discomfort- encouraged maintenance of regular bowel movements and avoiding strainingBlood on paper/underwear- as above likely related to urethral prolapse- UAs have not indicated hematuria and patient has never had gross hematuria- Discussed a full hematuria workup, but patient declined at this time RTC: 6 months Orders Placed This Encounter Procedures ? POCT Post Void Residual ? POC urinalysis manual w/o scope Patient will let us know if any new problems or  symptoms arise in the interim________________Scribed Ester Rinkarianne, MD by Joya Salm, medical scribe August 15, 2022  The documentation recorded by the scribe accurately reflects the services I personally performed and the decisions made by me. I reviewed and confirmed all material entered and/or pre-charted by the scribe.

## 2022-09-03 ENCOUNTER — Encounter: Admit: 2022-09-03 | Payer: PRIVATE HEALTH INSURANCE | Primary: Internal Medicine

## 2022-09-03 ENCOUNTER — Telehealth: Admit: 2022-09-03 | Payer: PRIVATE HEALTH INSURANCE | Attending: Internal Medicine | Primary: Internal Medicine

## 2022-09-03 NOTE — Telephone Encounter
[place]  [place] would like Dr Hyacinth Meeker to call in a prescription.Best contact [phone]

## 2022-09-03 NOTE — Telephone Encounter
Sent pt mychart message with this information

## 2022-09-03 NOTE — Telephone Encounter
Pt is having a flare of her UC. She is currently on Lialda. She is having urgency and discomfort. She wanted to know if Dr. Miller can call in anything to get her through this. She is [place] [place] in [place] [facility].

## 2022-09-09 NOTE — Progress Notes
[facility]		Electrophysiology New Pt visit Note October Lori Mckinney is a for evaluation of atrial arrhythmia. Referred by Dr. Preston Fleeting after results of event monitor.Lori Mckinney is a pleasant 61 y.o. y.o. female with past medical history significant for hyperlipidemia (on statin), hypothyroidism (on Synthroid), ulcerative colitis, fatty liver without liver fibrosis that presents for evaluation of atrial arrhythmia noticed on an event monitor as below. Patient went to the ER on 05/29/2022 for witnessed syncopal event that lasted for about 1 minute by her husband. She was taking an epsom salt bath in the tub for 20-30 mins for an achy neck. Was sitting on side of the tub draining the water and felt thirst so was waiting for husband to come with water. When she stood up, she felt dizzy and lightheaded. Her husband witnessed the entire event and was able to bearhug her and lower her back into the tub. Husband notes she was out for up to 1 min, her eyes rolled back. No tongue biting. Patient slowly came to with some remaining dizziness. No head trauma. She's had no further episodes since that time, in the ER she did report very remote history of near syncope in the past. Unremarkable EKG and labs in the ER. She improved with IV-fluids and was discharged from the ER. No headaches, dyspnea, nausea, vomiting, chest pain prior or palpitations. No incontinence after syncopal episode. She reported that in early 05/2022, she felt lightheaded after bending over for a period ot time. Event monitor was placed, results as below.Event monitor 06/05/2022 - 07/04/2022: HR Range: 52-186 bpm. Average HR 78 bpm.Sinus rhythm with PACs, episodes of SVT of undetermined origin, probably atrial tachycardia, of up to over 1 minute. Event monitor said 0 events of AF.Chadvasc score consist with the following - Female. Not on anticoagulation.07/25/2022 - She presents to my office today with her spouse. She reports that prior to syncopal event on 05/29/2022 at 6:30 pm in the evening, she felt a little tired and thirsty. Last time she ate that day was around 10 am. She did feel 1 episode of palpitations at home. Intermittent dizziness upon standing after bending down. Does not check BP at home. At doctors office BP is around systolic 120s mmHg. Has not been told of having low blood pressure.She has lost about 16-17 lbs with Ozempic.She reports being sick with COVID-19 around 07/02/2022 after going to a wedding the weekend before.Leaving for [place] early 08/2022, coming back to [place] around April or May 2024.Denies chest pain, dyspnea, fatigue, lightheadedness, fevers, chills.No other recent hospital or ER visits.MEDICATIONS, ALLERGIES, SH, FH: 1 alcoholic drink every 2 weeks or so. Prior history of smoking, no current tobacco use.Mother passed away of uterine cancer. Mother had diabetes.No family history of heart disease or sudden cardiac death.Brother history of diabetes.Family members with HTN.Family and social history were reviewed with the patient at this encounterSocial history :  reports that she has quit smoking. She has never used smokeless tobacco. She reports current alcohol use of about 4.0 standard drinks of alcohol per week. She reports current drug use.Patient Active Problem List Diagnosis ? Annual physical exam ? Hyperlipidemia ? Other specified hypothyroidism ? Ulcerative colitis (HC Code) ? Fatty liver ? Post-menopause atrophic vaginitis ? Osteopenia, unspecified location ? TMJ (temporomandibular joint syndrome) ? Family history of uterine cancer ? Cervicalgia Current Outpatient Medications Medication Instructions ? azelaic acid (FINACEA) 15 % gel No dose, route, or frequency recorded. ? azelastine-fluticasone (DYMISTA) 137-50 mcg/spray nasal spray USE 1 SPRAY IN BOTH  NOSTRILS TWICE DAILY ? cholecalciferol (vitamin D3) 3,000 Units, Oral, Daily ? clobetasoL (TEMOVATE) 0.05 % external solution No dose, route, or frequency recorded. ? crisaborole (EUCRISA) 2 % Oint Apply topically to the affected areas on the arms and face as needed for maintenance. ? cyclobenzaprine (FLEXERIL) 5-10 mg, Oral, 2 TIMES DAILY PRN ? estradioL (VAGIFEM) 10 mcg vaginal tablet Insert 1 per vagin only 2x a week at night ? gabapentin (NEURONTIN) 600 mg, Oral, 3 Times Daily With Meals ? LIALDA 1.2 gram delayed release tablet TAKE 4 TABLETS BY MOUTH  ONCE DAILY ? Lialda 4.8 g, Oral, Daily ? mesalamine (CANASA) 1,000 mg, Rectal, Nightly ? OZEMPIC 2 mg/dose (8 mg/3 mL) pen injector INJECT SUBCUTANEOUSLY 2 MG EVERY WEEK ? rosuvastatin (CRESTOR) 5 mg tablet TAKE 1 TABLET BY MOUTH  DAILY ? Synthroid 50 mcg, Oral, Every 24 Hours Scheduled  Allergies: Methyldibromoglutaronitrile and Oleamidopropyl dimethylaminePast Medical History: Diagnosis Date ? COVID-19 09/11/2019 ? Epistaxis  ? Hypercholesteremia  ? Hypothyroidism  ? Thickened endometrium 07/11/2020 ? TMJ (dislocation of temporomandibular joint)  ? Ulcerative colitis (HC Code)   Past Surgical History: Procedure Laterality Date ? COLONOSCOPY   ? LAPAROSCOPIC OVARIAN CYSTECTOMY  2006  REVIEW OF SYSTEMS: she reports intermittent dizziness, 1 episode of palpitation. She denies headache, light-headedness.  No reports of nausea/vomiting/fever/chills.  she denies chest pain, dyspnea on exertion, orthopnea, or paroxysmal nocturnal dyspnea. Lower extremity edema .no.  There is no abdominal pain, constipation, diarrhea, dysuria, or urinary frequency.  Rectal or urinary bleeding - no, No new rashes or muscle aches. Remaining 10-system review of systems is otherwise normal.PHYSICAL EXAMINATION:BP (P) 92/76 (Site: l a, Position: Sitting)  - Pulse (P) 69  - Ht (P) 5' 1 (1.549 m)  - Wt (P) 63.5 kg  - BMI (P) 26.45 kg/m? General appearance: alert, well appearing, and in no distress and oriented to person, place, and timeNeck exam - supple, no significant JVD elevation, No sig thyroid enlargement. Chest: clear to auscultation, no wheezes, rales or rhonchi, symmetric air entry, no tachypnea, retractions or cyanosisCVS exam: Rhythm regular.  S1 and S2.  No significant murmurs, gallops, or rubs.Abdominal exam: soft, nontender, nondistended, bowel sound noted intact.Exam of extremities: no pedal edema noted, no clubbing or cyanosis STUDIES: ECHO:No results found.Strest Test results:No results found for this or any previous visit. Event Monitor:06/05/2022 - 07/04/2022: HR Range: 52-186 bpm. Average HR 78 bpm.Sinus rhythm with PACs, episodes of SVT of undetermined origin, probably atrial tachycardia, of up to over 1 minute. 0 events of AF noted by event monitor.EKG: 07/25/2022 - sinus rhythm at a rate of 69 bpm, PAC noted.LABORATORY VALUES: CMP:Lab Results Component Value Date  BUN 15 05/29/2022  CREATININE 0.70 05/29/2022  NA 138 05/29/2022  K 4.0 05/29/2022  ALT 37 (H) 05/15/2022  AST 25 05/15/2022 No results found for requested labs within last 7 days. Summary: In summary, Lori Mckinney is a 61 y.o. female with past medical history significant for hyperlipidemia (on statin), hypothyroidism (on Synthroid), ulcerative colitis, fatty liver without liver fibrosis that presents for evaluation of atrial arrhythmia noticed on her 05/2022 30 day event monitor.Regarding the syncopal episode, her BP today is 92/76 mmHg. I reviewed prior BPs (pt does not take BP at home) and the systolic BP is usually around 098-119 mmHg. Patient does report dizziness with standing after bending down. I favor that her syncope could have been due to orthostatic hypotensive episode after being in the bathtub (warm water) with possibly being slightly dehydrated at that time. Extensively  discussed pathophysiology. Recommend adequate hydration with salt intake and avoiding alcohol. If there is recurrence, consider laying down on the floor and lift legs up to improve blood flow and counteract the mechanism.Regarding the arrhythmia noted on the event monitor, I personally reviewed the event monitor. I favor that these events are SVT. As she had surrounding that time COVID-19 infection (can cause myocarditis), this could have been a trigger. Patient reports having COVID-19 around 07/02/2022 after attending a wedding the weekend before. TSH was wnl on 05/15/2022. I do not favor Afib when reviewing the monitor. Given that there is very low burden of atrial tachycardia at this time on the 30-day event monitor, I discussed that an ablation would be difficult. I do not suspect sleep apnea as a trigger at this time as most events occurred on one day (07/02/2022). I discussed that if the frequency increases with more events recurrence, we may also consider sleep study to evalute for sleep apnea if events occur at night. As I suspect that the atrial tachycardia events were related to COVID-19, I recommend getting another 30-day event monitor for confirmation this. She understands and agrees to get the event monitor in 2 weeks time. She is traveling to [facility] in early 08/2022 and will be back in [place] around April/May 2024. If there are no events on the monitor, we can be reassured that the events were isolated events related to COVID-19 infection. If there are events on the monitor, we can consider medical therapy at that time. I have also ordered an echo to evaluate cardiac structure and function.Further management pending results of the 30-day event monitor. No medication changes were made at this time.She will return to via telephone visit 3-4 weeks after event monitor is done, but is instructed to call me if any issues arise between now and then. Patient Care Team:Craig, Janalee Dane, MD as PCP - General (Internal Medicine)Leventhal, Hervey Ard, MD as Physician (Dermatology)Suozzi, Hollice Espy, MD as Physician (Dermatology)Rhee, Sallye Lat, MD (Obstetrics and Gynecology)Miller, Daisy Floro, MD as Physician (Internal Medicine) Scribed for Rutha Bouchard, MD by Delories Heinz, medical scribe October 11, 2023The documentation recorded by the scribe accurately reflects the services I personally performed and the decisions made by me. I reviewed and confirmed all material entered and/or pre-charted by the scribe.

## 2022-09-10 ENCOUNTER — Encounter: Admit: 2022-09-10 | Payer: PRIVATE HEALTH INSURANCE | Attending: Internal Medicine | Primary: Internal Medicine

## 2022-09-10 DIAGNOSIS — K5190 Ulcerative colitis, unspecified, without complications: Secondary | ICD-10-CM

## 2022-09-11 ENCOUNTER — Encounter: Admit: 2022-09-11 | Payer: PRIVATE HEALTH INSURANCE | Attending: Internal Medicine | Primary: Internal Medicine

## 2022-09-11 ENCOUNTER — Encounter: Admit: 2022-09-11 | Payer: PRIVATE HEALTH INSURANCE | Primary: Internal Medicine

## 2022-09-11 DIAGNOSIS — K5190 Ulcerative colitis, unspecified, without complications: Secondary | ICD-10-CM

## 2022-09-11 LAB — C-REACTIVE PROTEIN     (CRP): C-REACTIVE PROTEIN: 0.7 mg/L (ref <8.0)

## 2022-09-11 NOTE — Telephone Encounter
PT called in to get a [place] to [place] a [place] and some notes/labs and last procedure notes/Path notes Please call PT back [phone] send Referral to:Dr  [facility] [phone] # [phone]

## 2022-09-11 NOTE — Telephone Encounter
Faxed a referral, office notes, and labs to Dr. Canary Brim DO

## 2022-09-19 ENCOUNTER — Telehealth: Admit: 2022-09-19 | Payer: PRIVATE HEALTH INSURANCE

## 2022-09-24 ENCOUNTER — Telehealth: Admit: 2022-09-24 | Payer: PRIVATE HEALTH INSURANCE | Primary: Internal Medicine

## 2022-09-24 NOTE — Telephone Encounter
Patient would like results from  Event monitor

## 2022-09-25 ENCOUNTER — Telehealth: Admit: 2022-09-25 | Payer: PRIVATE HEALTH INSURANCE | Primary: Internal Medicine

## 2022-09-25 NOTE — Telephone Encounter
Pt called again to receive results of MCT- report complete in [employer], needs to be read. Please call pt with update #[phone]

## 2022-10-17 NOTE — Telephone Encounter
Normal echo findings. Written by NimrRutha Bouchard MD on 08/23/2022 10:45 PM ESTGreat results from the monitor , as above. Written by Rutha Bouchard, MD on 10/07/2022 8:37 AM ESTReviewed MD notes with pt. No further questions/concerns.

## 2022-10-17 NOTE — Telephone Encounter
Pt called backWould like holter and echo results Please advise

## 2022-10-22 ENCOUNTER — Encounter
Admit: 2022-10-22 | Payer: PRIVATE HEALTH INSURANCE | Attending: Student in an Organized Health Care Education/Training Program | Primary: Internal Medicine

## 2022-10-22 DIAGNOSIS — N342 Other urethritis: Secondary | ICD-10-CM

## 2022-10-22 DIAGNOSIS — N399 Disorder of urinary system, unspecified: Secondary | ICD-10-CM

## 2022-10-23 MED ORDER — ESTRADIOL 0.01% (0.1 MG/GRAM) VAGINAL CREAM
0.01 % | VAGINAL | 3 refills | Status: AC
Start: 2022-10-23 — End: ?

## 2022-12-13 ENCOUNTER — Encounter: Admit: 2022-12-13 | Payer: BLUE CROSS/BLUE SHIELD | Attending: Pain Medicine | Primary: Internal Medicine

## 2022-12-20 ENCOUNTER — Encounter: Admit: 2022-12-20 | Payer: PRIVATE HEALTH INSURANCE

## 2022-12-20 MED ORDER — AZELASTINE 137 MCG-FLUTICASONE 50 MCG/SPRAY NASAL SPRAY
137-50 | NASAL | 6 refills | 30.00000 days | Status: AC
Start: 2022-12-20 — End: ?

## 2023-01-01 ENCOUNTER — Encounter: Admit: 2023-01-01 | Payer: PRIVATE HEALTH INSURANCE | Attending: Internal Medicine

## 2023-01-02 MED ORDER — ROSUVASTATIN 5 MG TABLET
5 | ORAL_TABLET | ORAL | 4 refills | 90.00000 days | Status: AC
Start: 2023-01-02 — End: 2023-11-13

## 2023-01-03 ENCOUNTER — Encounter: Admit: 2023-01-03 | Payer: PRIVATE HEALTH INSURANCE | Attending: Urology | Primary: Internal Medicine

## 2023-01-03 DIAGNOSIS — N399 Disorder of urinary system, unspecified: Secondary | ICD-10-CM

## 2023-01-03 DIAGNOSIS — N342 Other urethritis: Secondary | ICD-10-CM

## 2023-01-04 MED ORDER — ESTRADIOL 0.01% (0.1 MG/GRAM) VAGINAL CREAM
0.01 % | VAGINAL | 3 refills | Status: AC
Start: 2023-01-04 — End: ?

## 2023-01-07 IMAGING — CT CT ENTEROGRAPHY WITH CONTRAST
3 of 4 series · 14 of 46 positions shown, 17 images · IV contrast (isovue)
Comparison: None

________________________________________________________________________________________________ 
CT ENTEROGRAPHY WITH CONTRAST, 01/07/2023 [DATE]: 
CLINICAL INDICATION: Ulcerative colitis. Intermittent rectal pain since August 2022. 
A search for DICOM formatted images was conducted for prior CT imaging studies 
completed at a non-affiliated media free facility.
TECHNIQUE: The region of interest was scanned with contrast on a high 
resolution low dose CT scanner.  75 mL of Isovue 300 MDV were injected 
intravenously. 0 mL of Isovue 300 MDV were discarded. The patient also drank 
approximately 9455 mL of oral contrast. Routine MPR reconstructions were 
performed. Count of known CT and Cardiac Nuclear Medicine studies performed in 
the previous 12 months = 0.

[Series 4: abd/pel with 3.0 i41s 2 · axial · 0.59mm/px · z∈[-482,-74]mm · 12 of 151 slices shown, 15 images]
[im 10/151  soft-tissue]
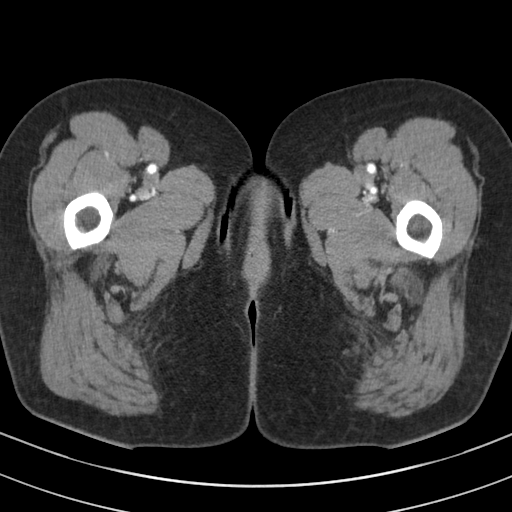
[im 10/151  bone]
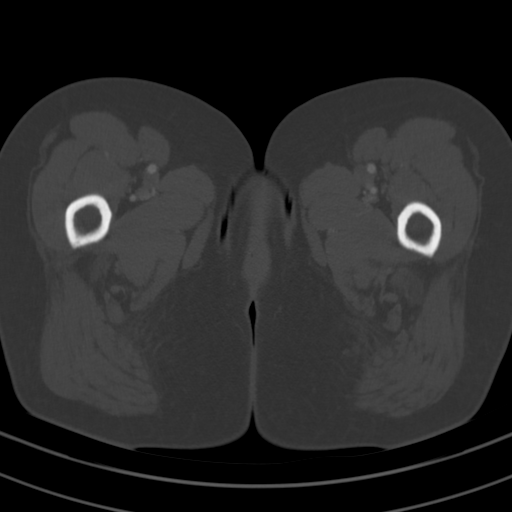
[im 30/151  soft-tissue]
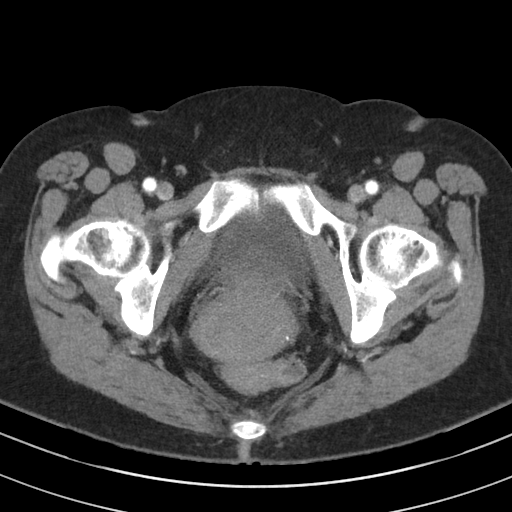
[im 44/151  soft-tissue]
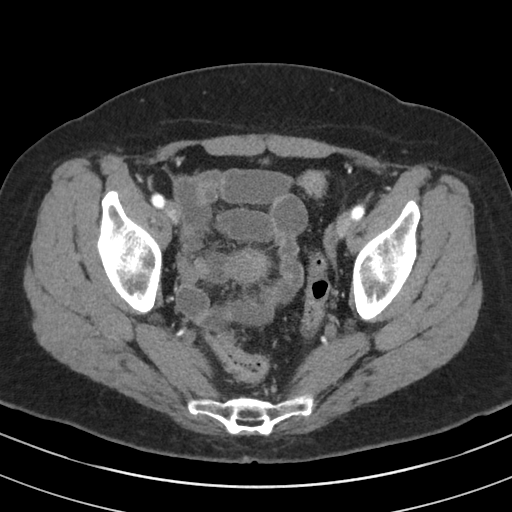
[im 59/151  soft-tissue]
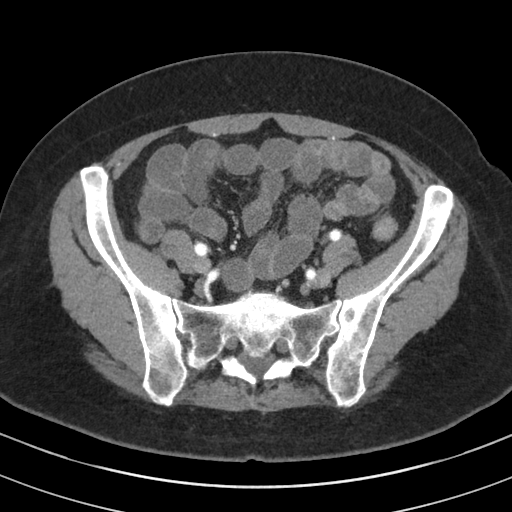
[im 78/151  soft-tissue]
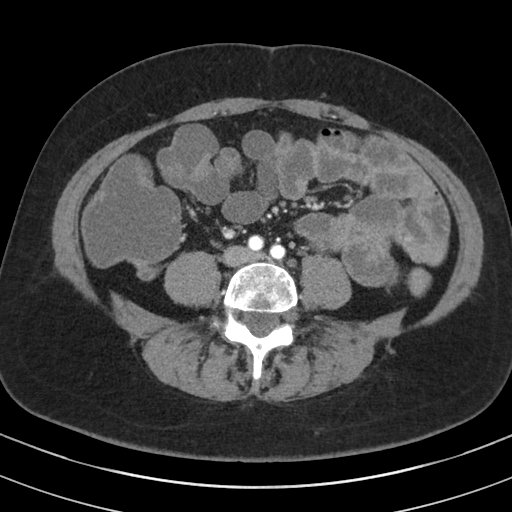
[im 92/151  soft-tissue]
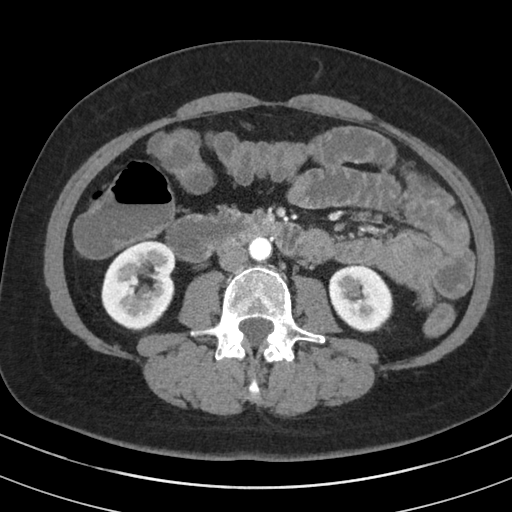
[im 107/151  soft-tissue]
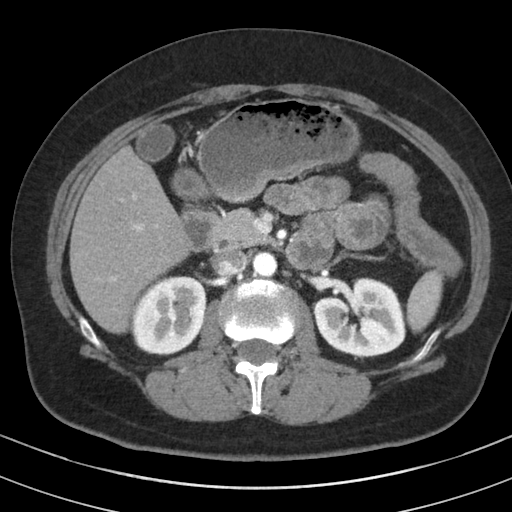
[im 126/151  soft-tissue]
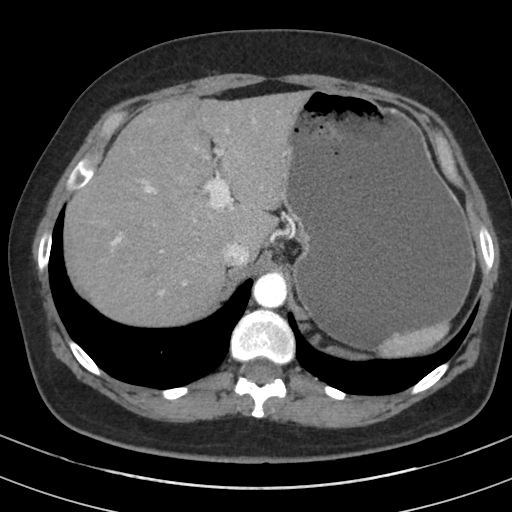
[im 131/151  lung]
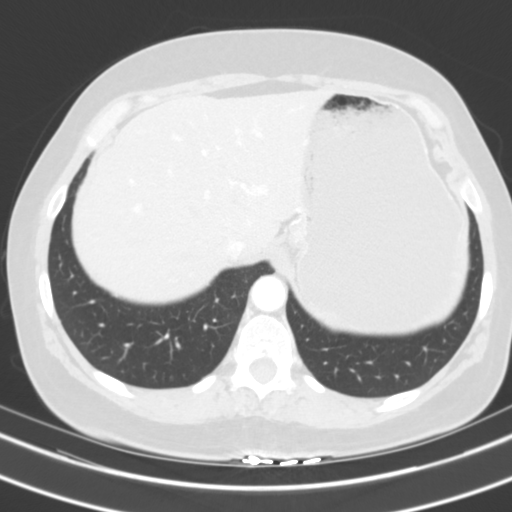
[im 136/151  lung]
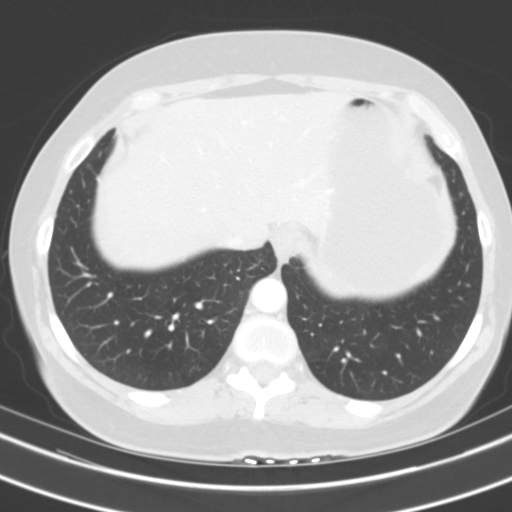
[im 141/151  soft-tissue]
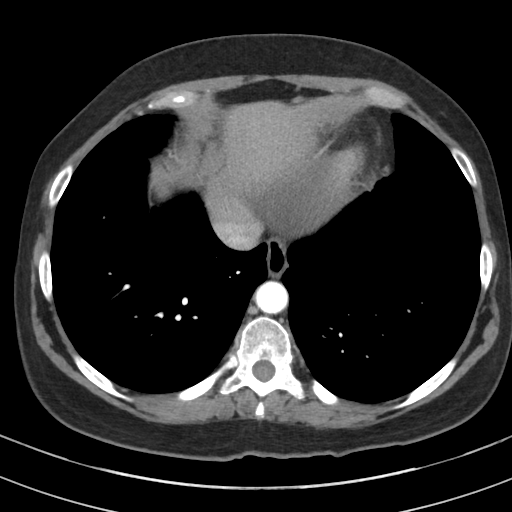
[im 141/151  lung]
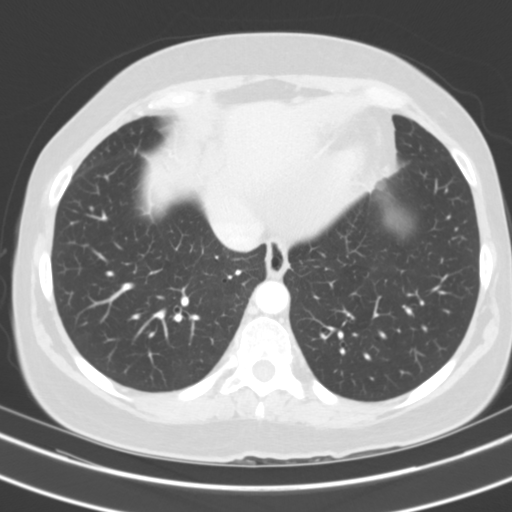
[im 141/151  bone]
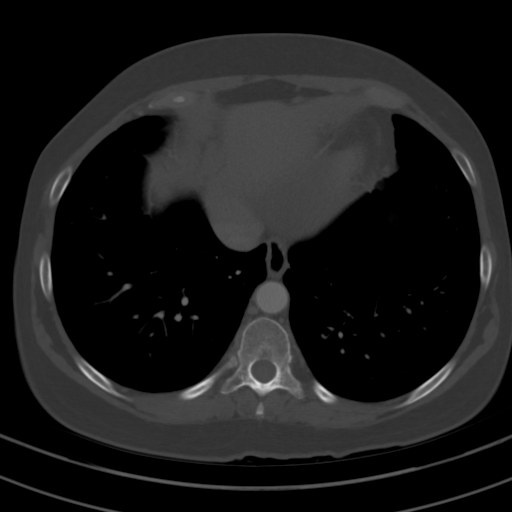
[im 146/151  lung]
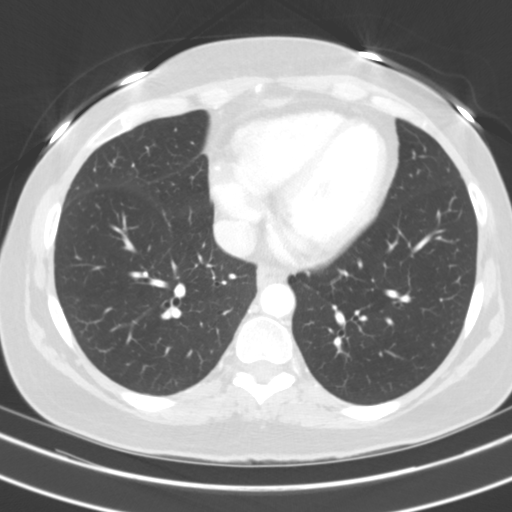

[Series 7: cor from thins · coronal · 0.61mm/px · 1 of 133 slices shown]
[im 67/133  soft-tissue]
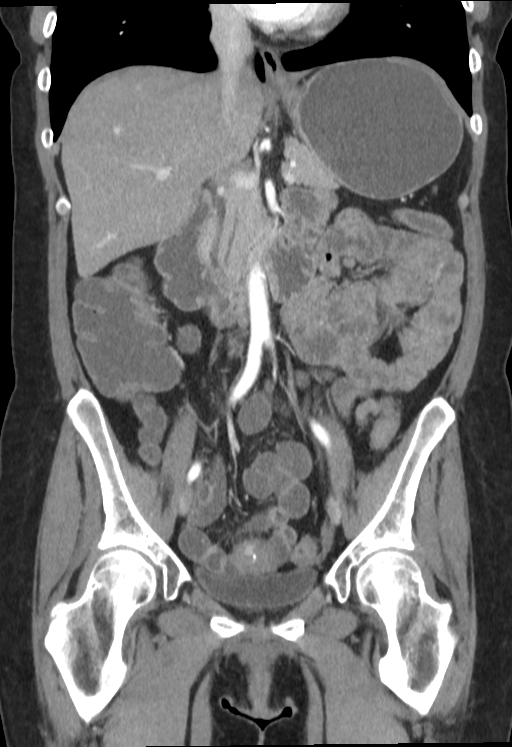

[Series 8: sag from thins · sagittal · 0.55mm/px · 1 of 162 slices shown]
[im 54/162  soft-tissue]
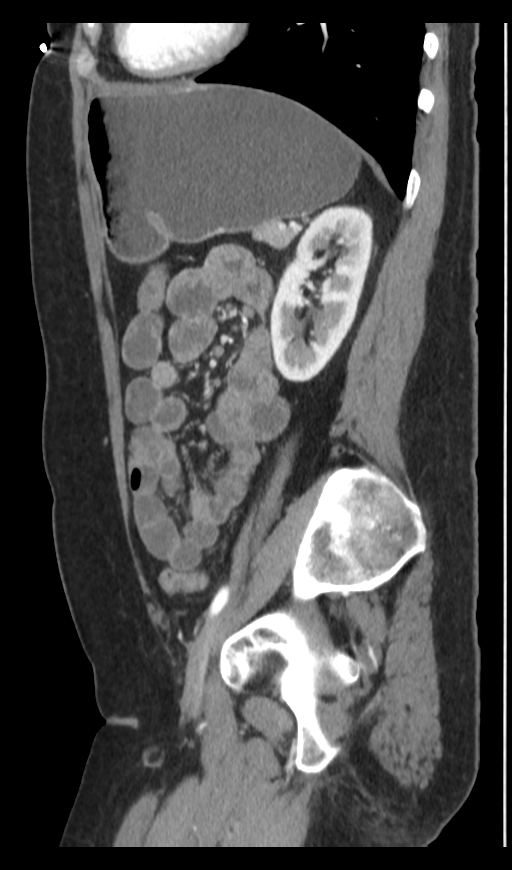

[14 of 46 positions shown; findings below may reference images not displayed]

FINDINGS: LUNG BASES: Lung bases are clear. No pleural effusions. 
HEPATOBILIARY: No mass or biliary dilatation. Cholelithiasis. 
SPLEEN: Normal in size. 
PANCREAS: No ductal dilatation or mass.   
ADRENALS: No mass. 
GENITOURINARY: No enhancing mass or hydronephrosis.  Bladder is unremarkable. 
LYMPH NODES: No adenopathy. 
STOMACH, SMALL BOWEL AND COLON: Fluid-filled stomach, small bowel loops and 
right colon consistent with recent ingestion oral contrast. No bowel wall 
thickening. No obstructive changes. Terminal ileum is within normal limits. The 
transverse colon is decompressed though I do not see wall thickening. 
VASCULAR STRUCTURES: No aneurysm.  
MUSCULOSKELETAL: No acute osseous abnormality. Scattered degenerative changes.  
ADDITIONAL FINDINGS: Fibroid uterus.
IMPRESSION: No wall thickening or obstructive changes identified. No acute inflammatory 
changes. 
RADIATION DOSE REDUCTION: All CT scans are performed using radiation dose 
reduction techniques, when applicable.  Technical factors are evaluated and 
adjusted to ensure appropriate moderation of exposure.  Automated dose 
management technology is applied to adjust the radiation doses to minimize 
exposure while achieving diagnostic quality images.

## 2023-02-13 IMAGING — MR MRI LUMBAR SPINE W/WO CONTRAST
8 of 12 series · 17 of 48 positions shown · IV contrast (gadavist)
Comparison: 01/07/2023 CT abdomen/pelvis

________________________________________________________________________________________________ 
MRI LUMBAR SPINE W/WO CONTRAST, MRI SACRUM W/WO CONTRAST, 02/13/2023 [DATE]: 
CLINICAL INDICATION: Low back pain, unspecified
TECHNIQUE: Multiplanar, multiecho position MR images of the lumbar spine 
and sacrum were performed without and with 6 mL of intravenous Gadavist. 1.5 mL 
of Gadavist were discarded. Patient was scanned on a 1.5T magnet.

[Series 101: survey · axial · 10.0mm · 1.25mm/px · 1 of 10 slices shown]
[im 1/10]
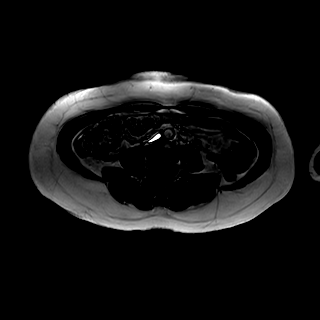

[Series 201: t2w_cor-surv · coronal · 6.0mm · 0.62mm/px · 1 of 14 slices shown]
[im 1/14]
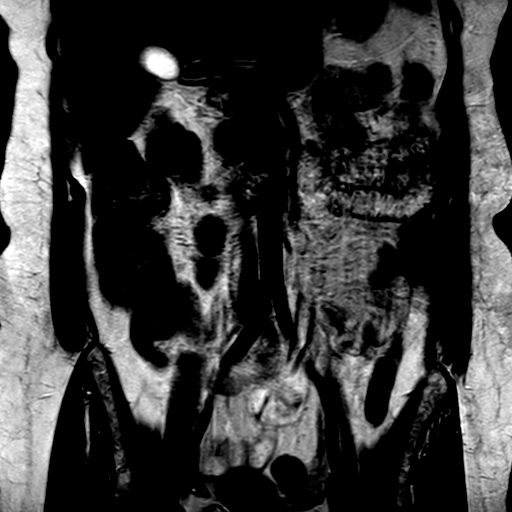

[Series 301: T1 · sagittal · 4.0mm · 0.46mm/px · 2 of 17 slices shown (1 of 2)]
[im 1/17]
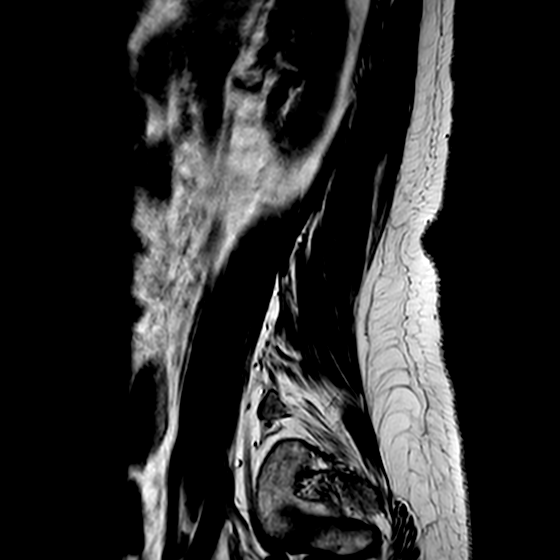
[im 17/17]
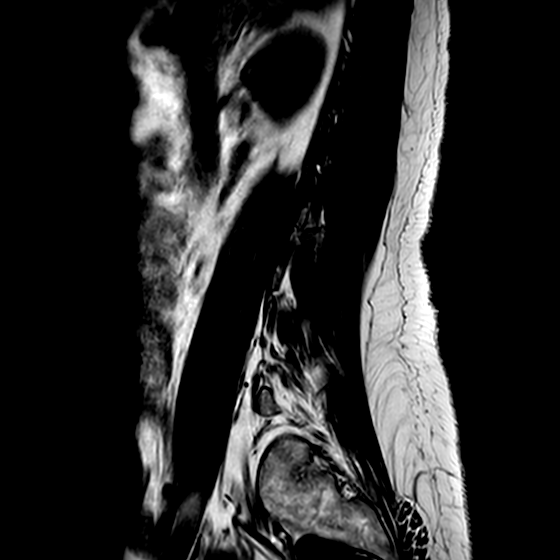

[Series 402: (id)_mdixon_tse · sagittal · 4.0mm · 0.50mm/px · 2 of 17 slices shown]
[im 1/17]
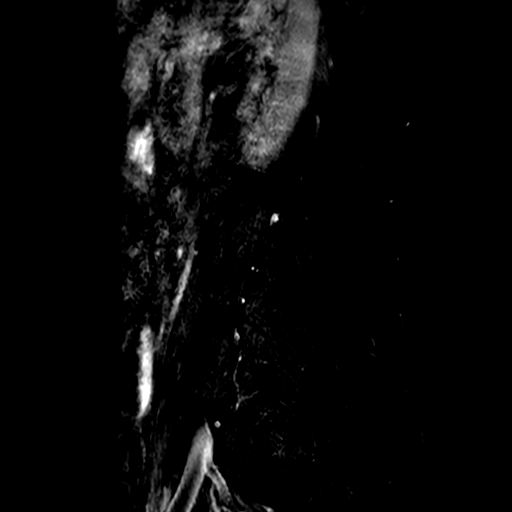
[im 17/17]
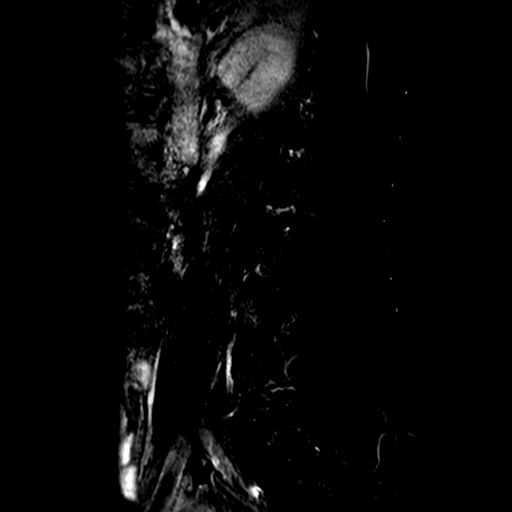

[Series 403: st2w_mdixon_tse · sagittal · 4.0mm · 0.50mm/px · 2 of 17 slices shown]
[im 1/17]
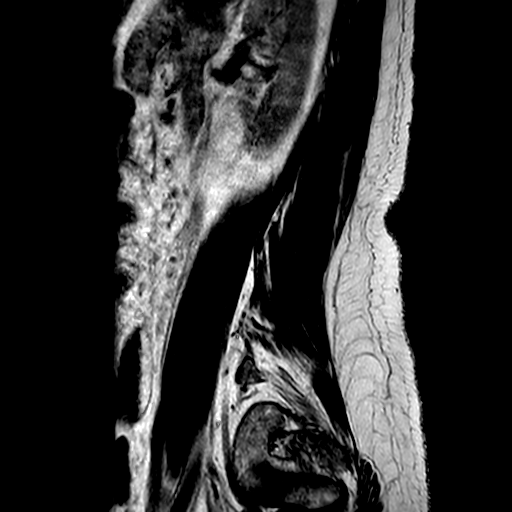
[im 17/17]
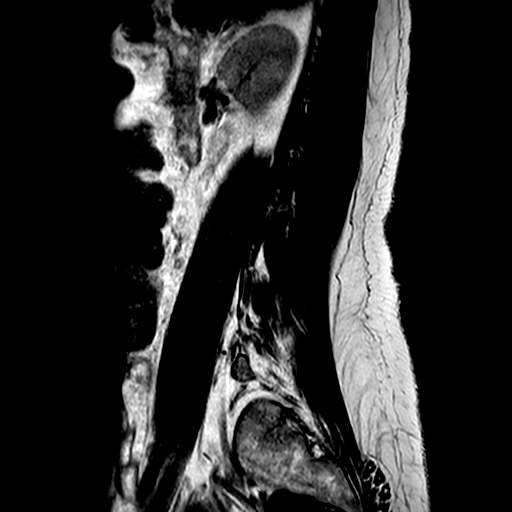

[Series 601: T2 · axial · 4.0mm · 0.30mm/px · z∈[-13,+212]mm · 3 of 30 slices shown]
[im 1/30]
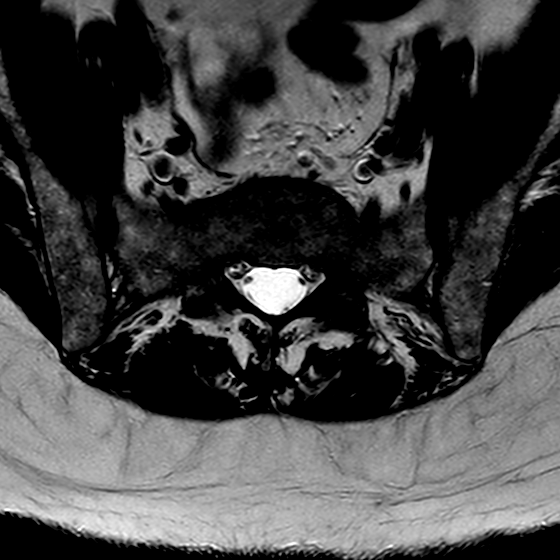
[im 15/30]
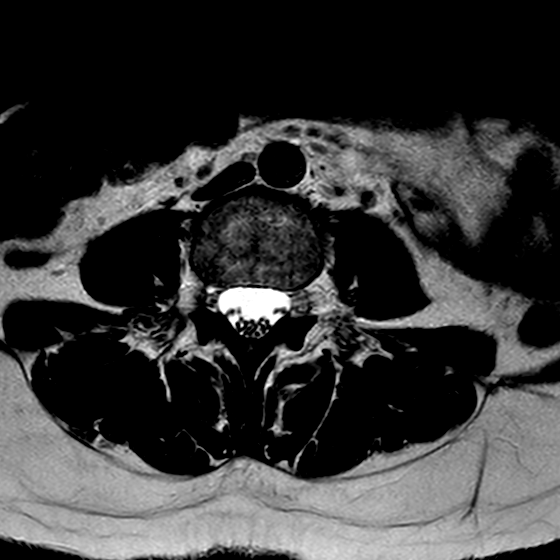
[im 30/30]
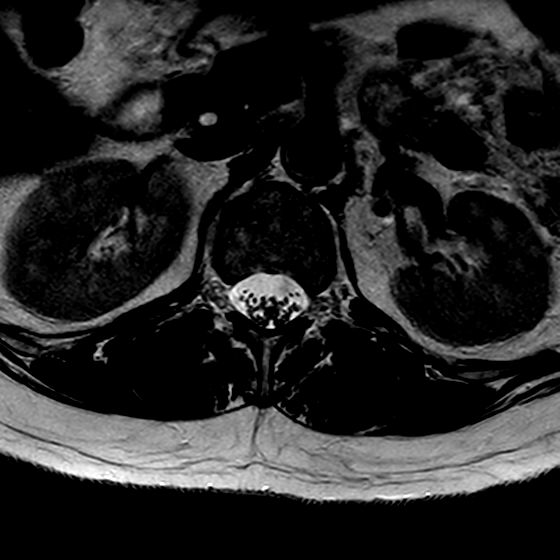

[Series 701: T1 · axial · 4.0mm · 0.33mm/px · z∈[-13,+212]mm · 3 of 30 slices shown (2 of 2)]
[im 1/30]
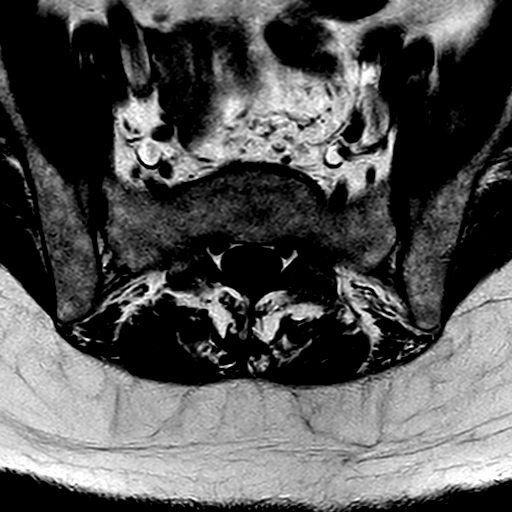
[im 15/30]
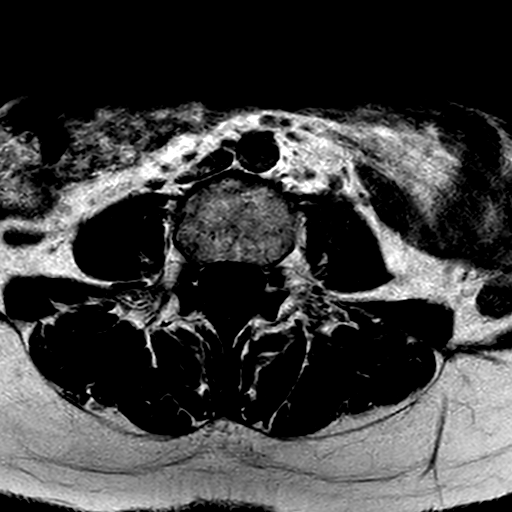
[im 30/30]
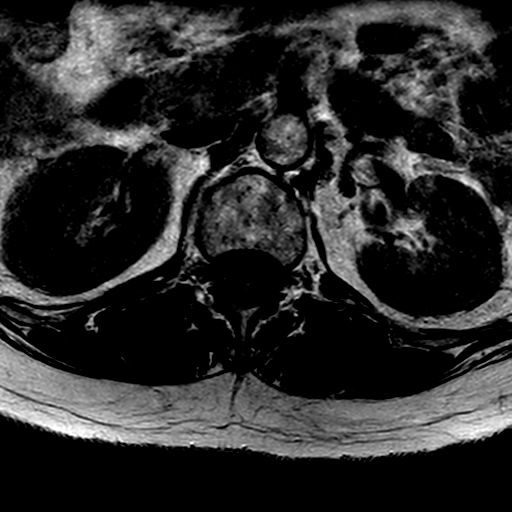

[Series 1901: T1 post-contrast · axial · 4.0mm · 0.33mm/px · z∈[-13,+212]mm · 3 of 30 slices shown]
[im 1/30]
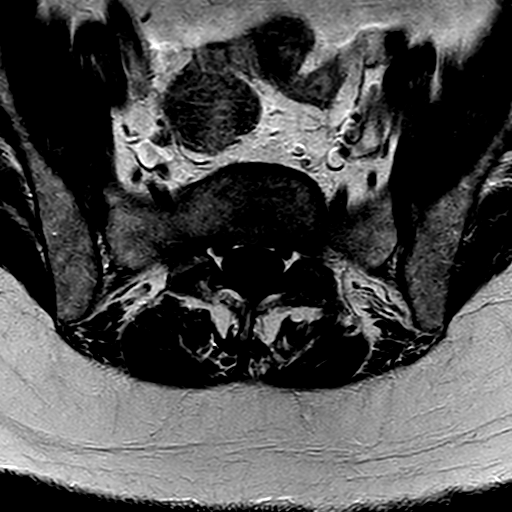
[im 15/30]
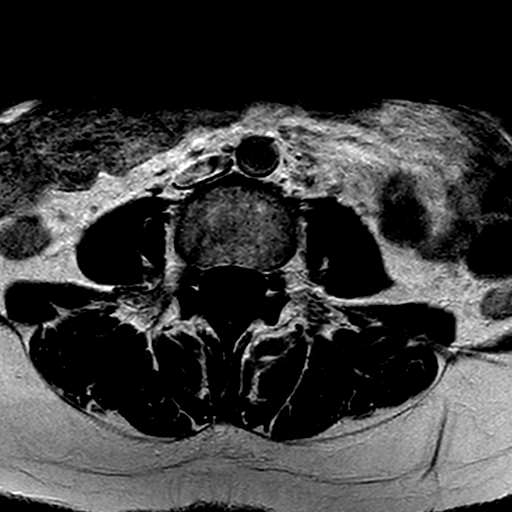
[im 30/30]
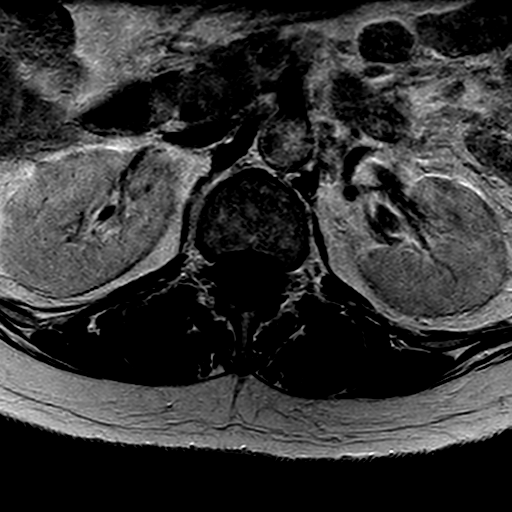

[17 of 48 positions shown; findings below may reference images not displayed]

FINDINGS: GENERAL: 
Nomenclature is based on 5 lumbar type vertebral bodies.     
ALIGNMENT: Normal. 
VERTEBRAE: No fracture. Small osteophytes.  
MARROW SIGNAL: No focal suspect signal abnormality. 
CORD SIGNAL: Normal distal spinal cord and cauda equina. Conus medullaris 
terminates at T12-L1. 
Modic I-II: None. 
Ligamentum Flavum > 2.5 mm: All levels 
SEGMENTAL: 
T12-L1: Normal disc height and signal. No herniation. Normal facets. No spinal 
canal or neural foraminal stenosis. 
L1-L2: Normal disc height and signal. No herniation. Normal facets. No spinal 
canal or neural foraminal stenosis. 
L2-L3: Minimal disc bulge. Normal disc height and signal. No herniation. Normal 
facets. No spinal canal or neural foraminal stenosis. 
L3-L4: Normal disc height with disc desiccation. No herniation. Normal facets. 
No spinal canal or neural foraminal stenosis. 
L4-L5: Central annular tear with mild adjacent enhancement. Mild disc bulge. 
Normal disc height with disc desiccation. No herniation. Normal facets. No 
spinal canal or neural foraminal stenosis. 
L5-S1: Normal disc height and signal. No herniation. Mild facet arthropathy. No 
spinal canal or neural foraminal stenosis. 
SACRUM/COCCYX: No fractures, marrow edema-like signal changes or marrow 
replacing lesions. Minimal subcortical cystic change of the 2 distal coccygeal 
segments. 
SI JOINTS: Preserved. 
ILIAC BONES: No fractures, marrow edema-like signal changes or marrow replacing 
lesions. 
SOFT TISSUES: Mildly prominent precoccygeal vessels. Small uterine fibroids 
measuring up to 1.4 cm. The aorta is normal in diameter. No mass or fluid 
collection.  
IMPRESSION 
1.  L4-L5 central annular tear. 
2.  Minimal degenerative change of the lumbar spine and distal coccyx. 
3.  Small uterine fibroids.

## 2023-02-13 IMAGING — MR MRI SACRUM W/WO CONTRAST
4 of 10 series · 13 of 48 positions shown · IV contrast (Gadolinium)
Comparison: 01/07/2023 CT abdomen/pelvis

________________________________________________________________________________________________ 
MRI LUMBAR SPINE W/WO CONTRAST, MRI SACRUM W/WO CONTRAST, 02/13/2023 [DATE]: 
CLINICAL INDICATION: Low back pain, unspecified
TECHNIQUE: Multiplanar, multiecho position MR images of the lumbar spine 
and sacrum were performed without and with 6 mL of intravenous Gadavist. 1.5 mL 
of Gadavist were discarded. Patient was scanned on a 1.5T magnet.

[Series 901: T1 · sagittal · 4.0mm · 0.43mm/px · 4 of 29 slices shown (1 of 3)]
[im 1/29]
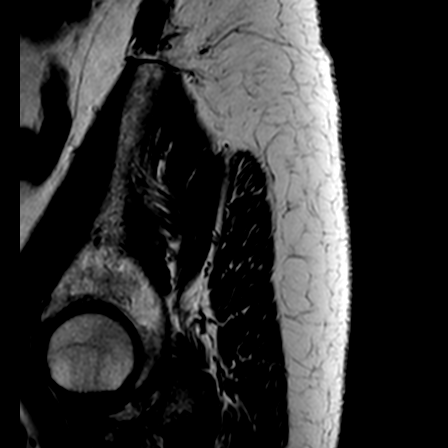
[im 8/29]
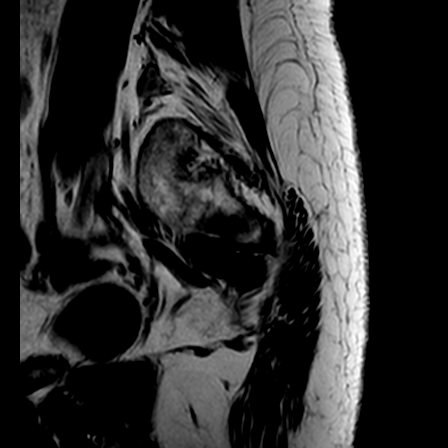
[im 15/29]
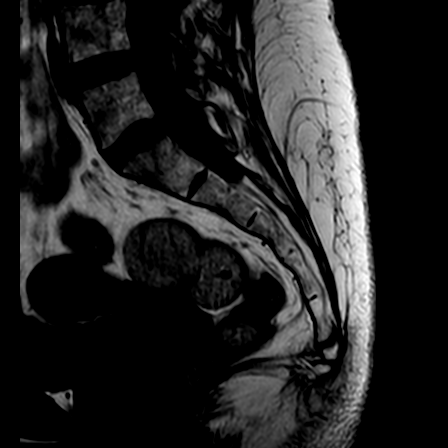
[im 29/29]
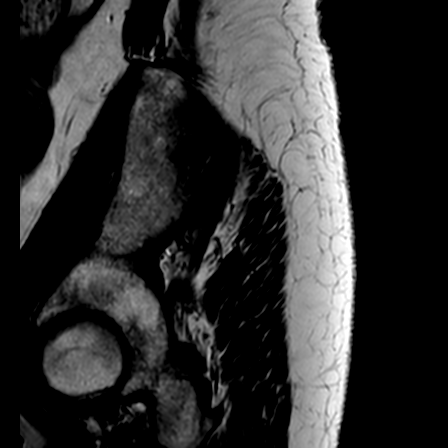

[Series 1101: T1 · oblique · 3.0mm · 0.32mm/px · 3 of 27 slices shown (2 of 3)]
[im 1/27]
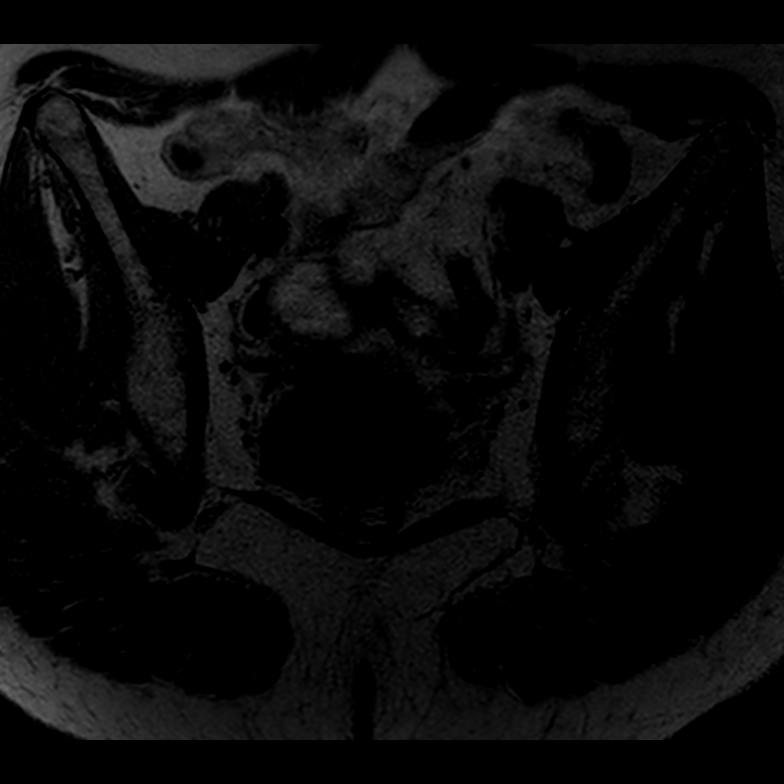
[im 14/27]
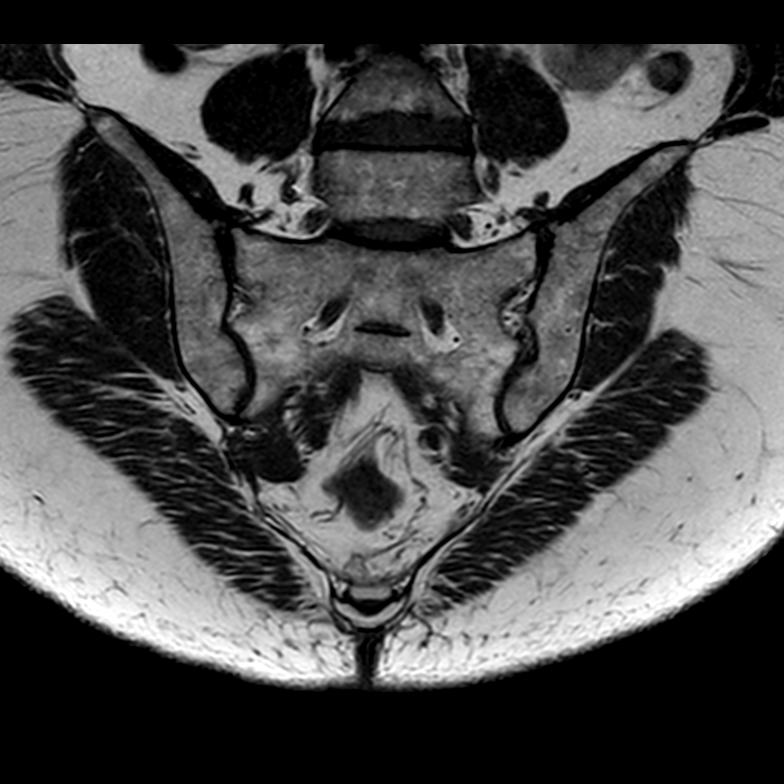
[im 27/27]
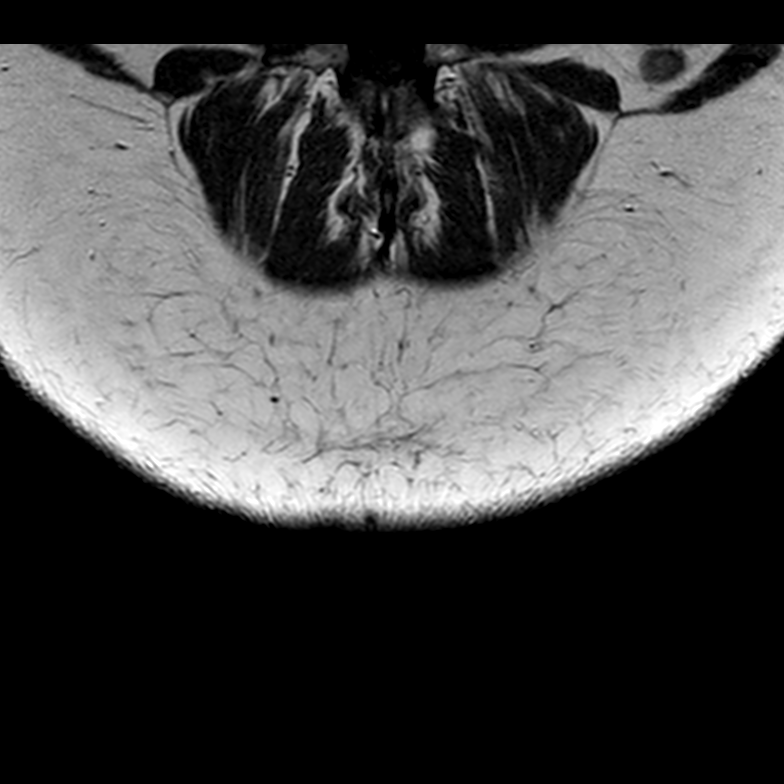

[Series 1301: T1 · axial · 4.0mm · 0.36mm/px · z∈[-93,+17]mm · 3 of 32 slices shown (3 of 3)]
[im 7/32]
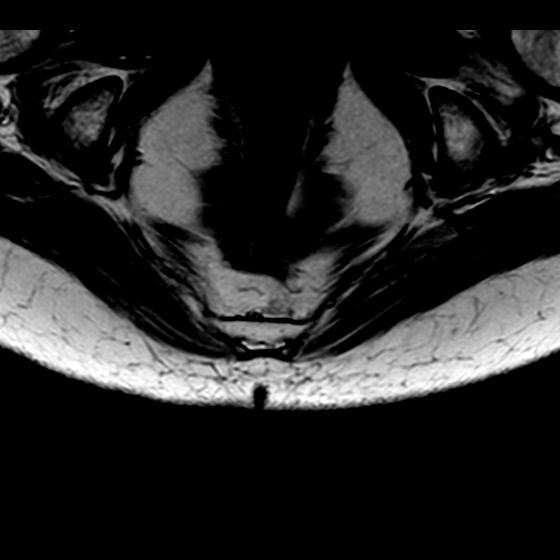
[im 19/32]
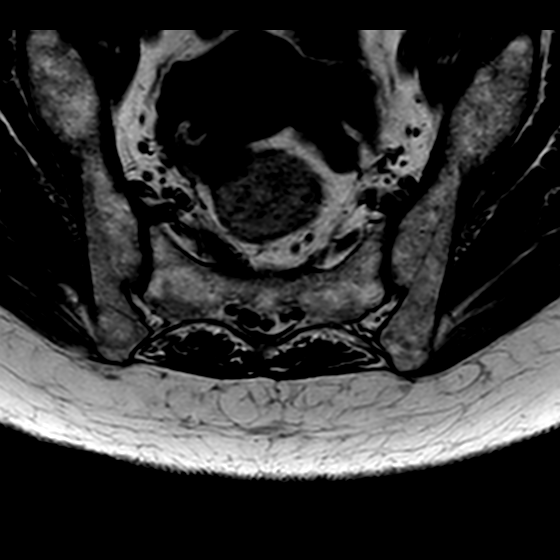
[im 32/32]
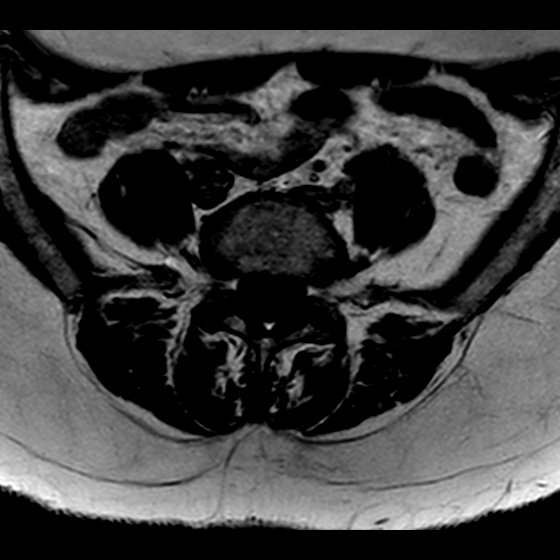

[Series 1501: T1 fat-sat post-contrast · axial · 4.0mm · 0.28mm/px · z∈[-93,+17]mm · 3 of 32 slices shown]
[im 7/32]
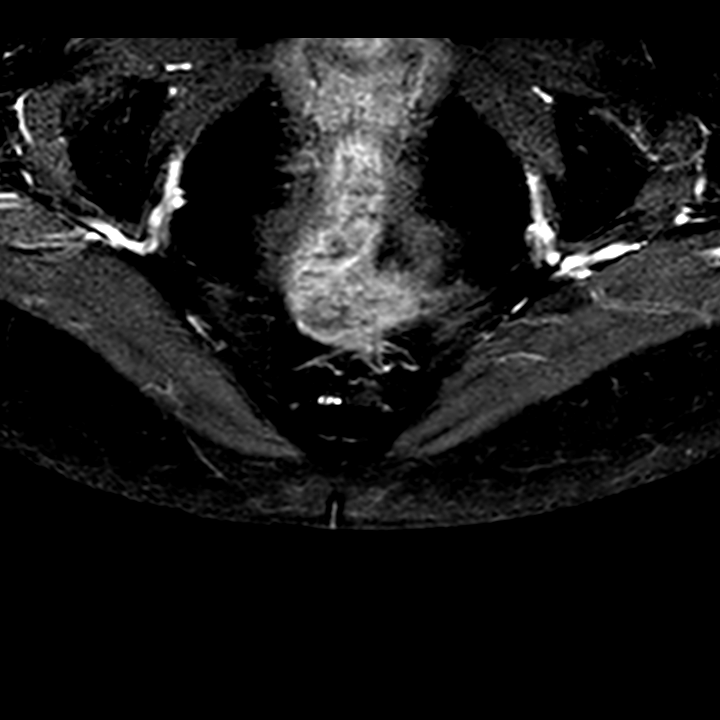
[im 19/32]
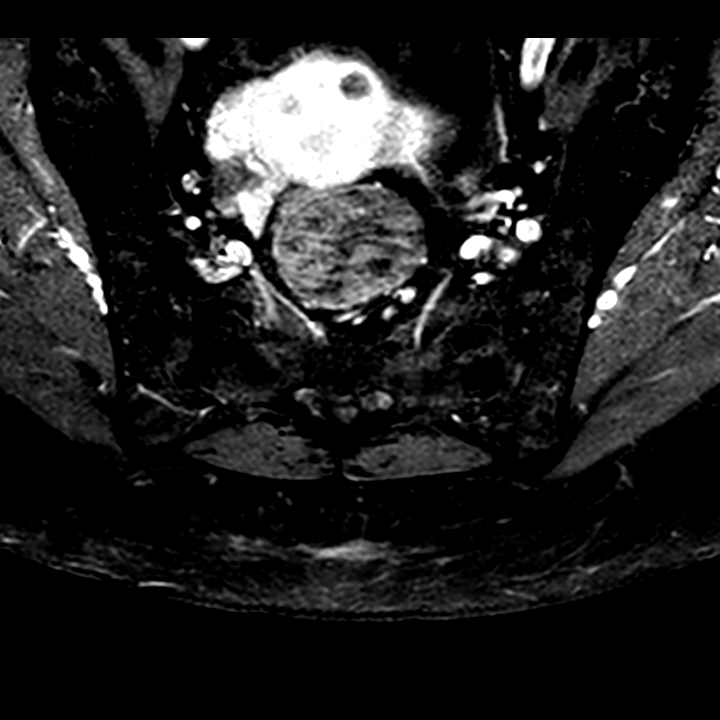
[im 32/32]
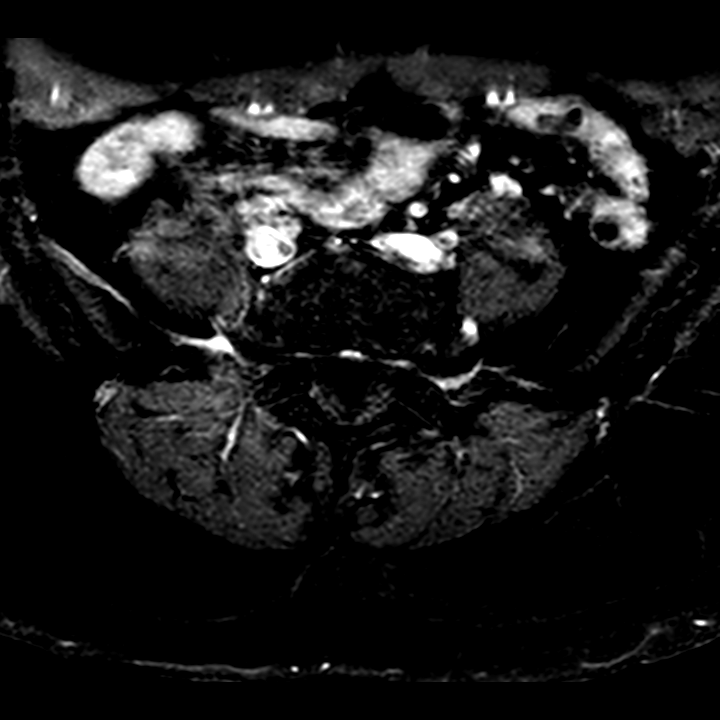

[13 of 48 positions shown; findings below may reference images not displayed]

FINDINGS: GENERAL: 
Nomenclature is based on 5 lumbar type vertebral bodies.     
ALIGNMENT: Normal. 
VERTEBRAE: No fracture. Small osteophytes.  
MARROW SIGNAL: No focal suspect signal abnormality. 
CORD SIGNAL: Normal distal spinal cord and cauda equina. Conus medullaris 
terminates at T12-L1. 
Modic I-II: None. 
Ligamentum Flavum > 2.5 mm: All levels 
SEGMENTAL: 
T12-L1: Normal disc height and signal. No herniation. Normal facets. No spinal 
canal or neural foraminal stenosis. 
L1-L2: Normal disc height and signal. No herniation. Normal facets. No spinal 
canal or neural foraminal stenosis. 
L2-L3: Minimal disc bulge. Normal disc height and signal. No herniation. Normal 
facets. No spinal canal or neural foraminal stenosis. 
L3-L4: Normal disc height with disc desiccation. No herniation. Normal facets. 
No spinal canal or neural foraminal stenosis. 
L4-L5: Central annular tear with mild adjacent enhancement. Mild disc bulge. 
Normal disc height with disc desiccation. No herniation. Normal facets. No 
spinal canal or neural foraminal stenosis. 
L5-S1: Normal disc height and signal. No herniation. Mild facet arthropathy. No 
spinal canal or neural foraminal stenosis. 
SACRUM/COCCYX: No fractures, marrow edema-like signal changes or marrow 
replacing lesions. Minimal subcortical cystic change of the 2 distal coccygeal 
segments. 
SI JOINTS: Preserved. 
ILIAC BONES: No fractures, marrow edema-like signal changes or marrow replacing 
lesions. 
SOFT TISSUES: Mildly prominent precoccygeal vessels. Small uterine fibroids 
measuring up to 1.4 cm. The aorta is normal in diameter. No mass or fluid 
collection.  
IMPRESSION 
1.  L4-L5 central annular tear. 
2.  Minimal degenerative change of the lumbar spine and distal coccyx. 
3.  Small uterine fibroids.

## 2023-03-20 ENCOUNTER — Ambulatory Visit
Admit: 2023-03-20 | Payer: BLUE CROSS/BLUE SHIELD | Attending: Student in an Organized Health Care Education/Training Program | Primary: Internal Medicine

## 2023-03-21 ENCOUNTER — Encounter: Admit: 2023-03-21 | Payer: BLUE CROSS/BLUE SHIELD | Attending: Internal Medicine | Primary: Internal Medicine

## 2023-03-27 IMAGING — DX C- SPINE COMPLETE W/ FLEX  AND EXTENSION 6 [PERSON_NAME]
1 series · 6 of 6 positions shown · non-contrast
Comparison: None

________________________________________________________________________________________________ 
C- SPINE COMPLETE W/ FLEX  AND EXTENSION 6 NORE, 03/27/2023 [DATE]: 
CLINICAL INDICATION: Cervical Pain.

[Series 1: AP · 0.14mm/px · 6 of 6 slices shown]
[im 1/6]
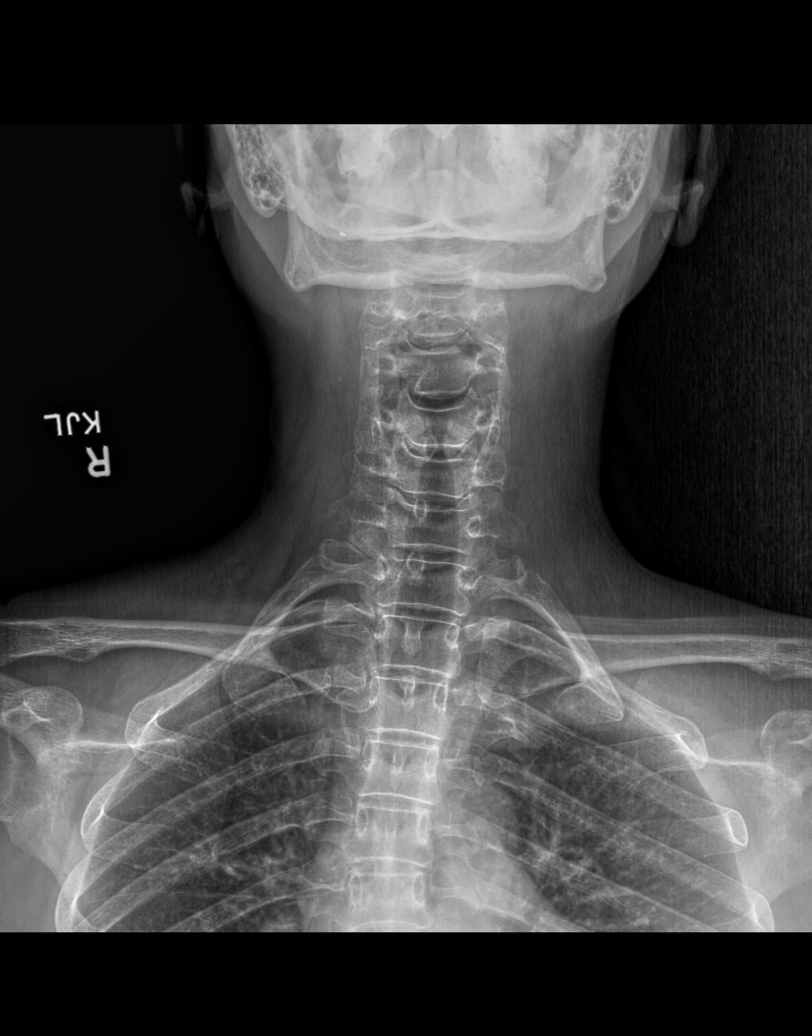
[im 2/6]
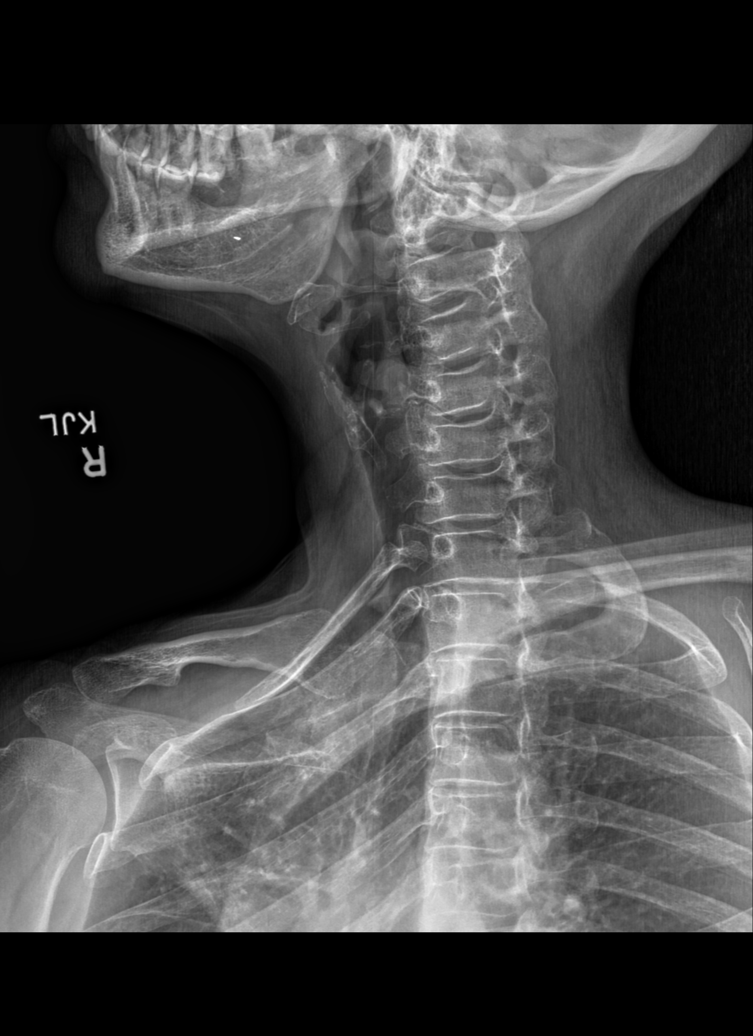
[im 3/6]
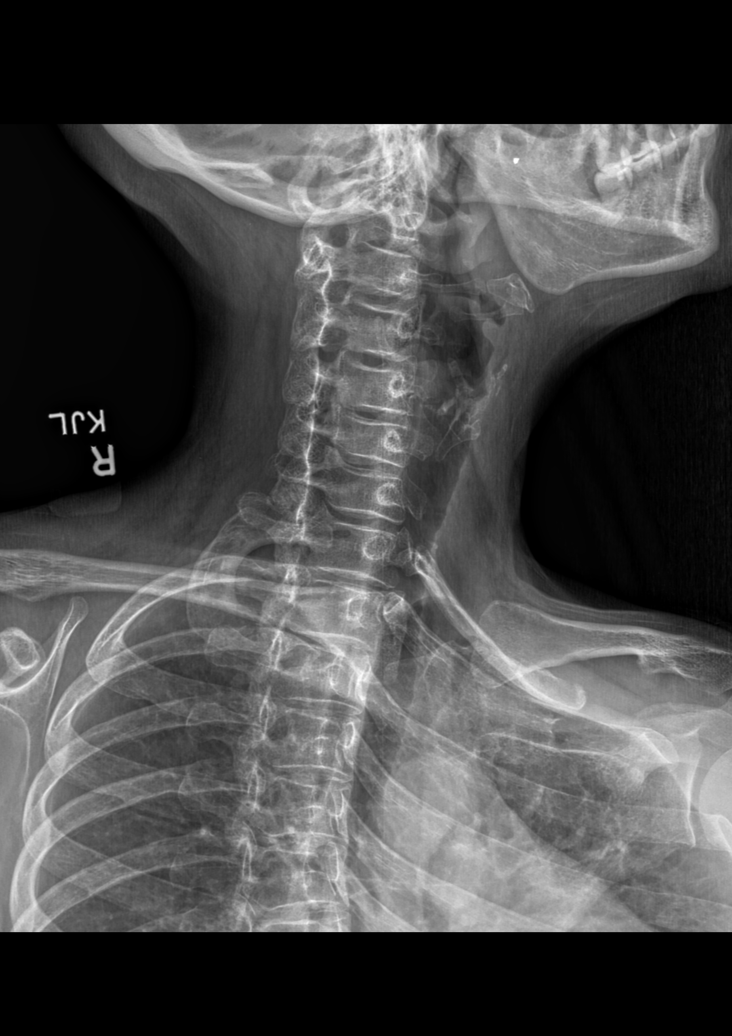
[im 4/6]
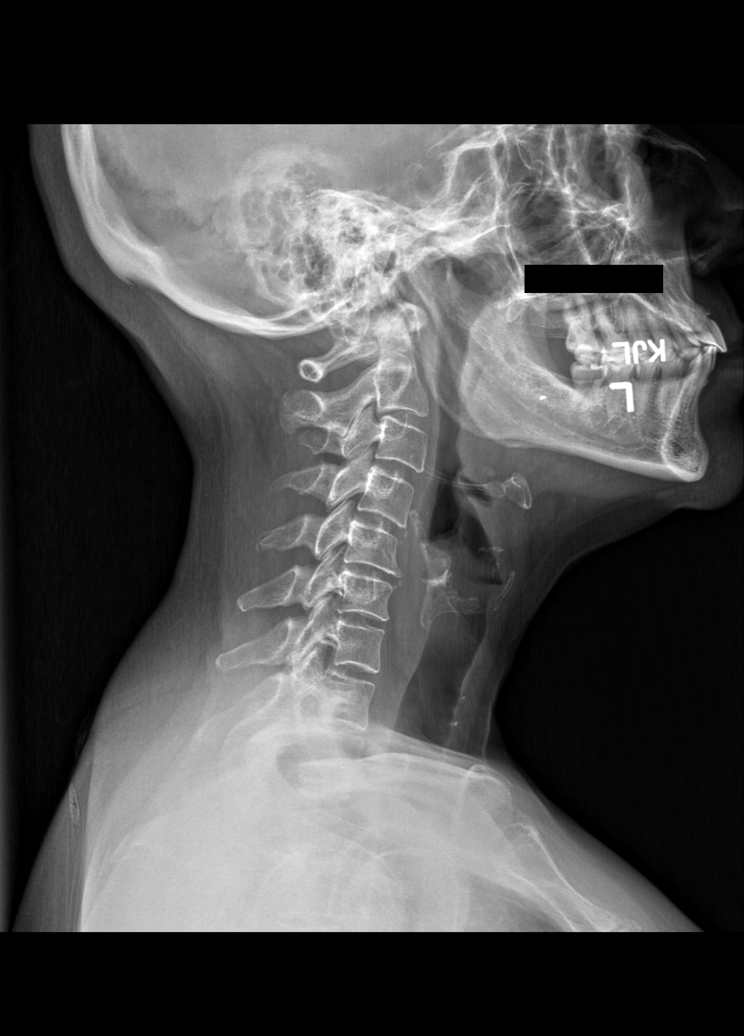
[im 5/6]
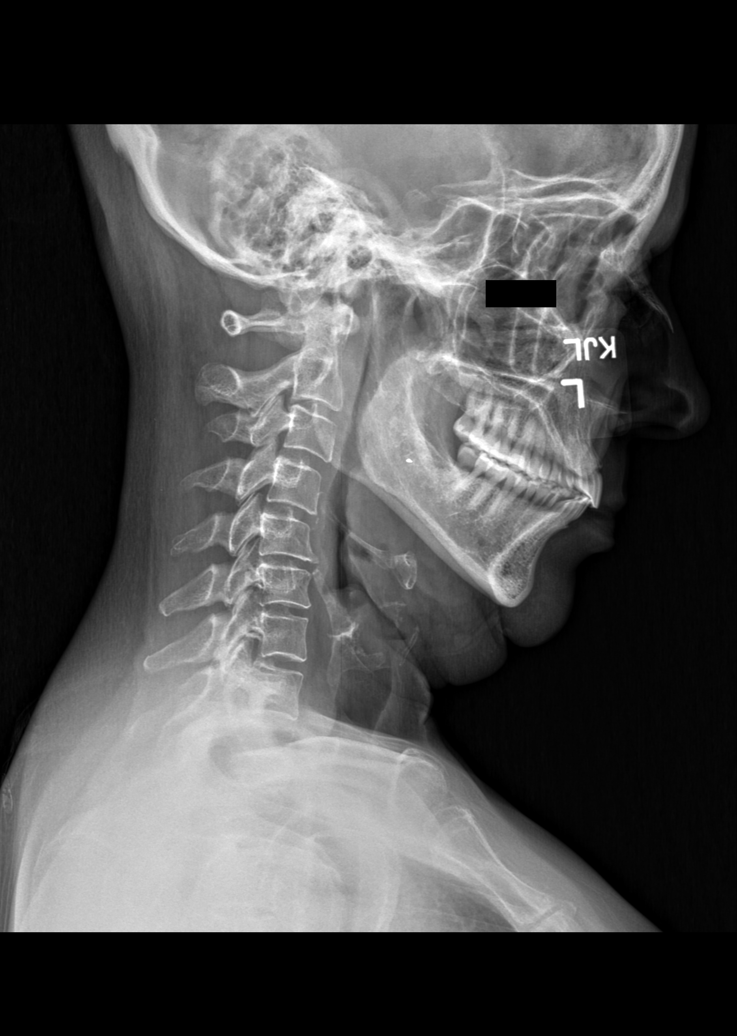
[im 6/6]
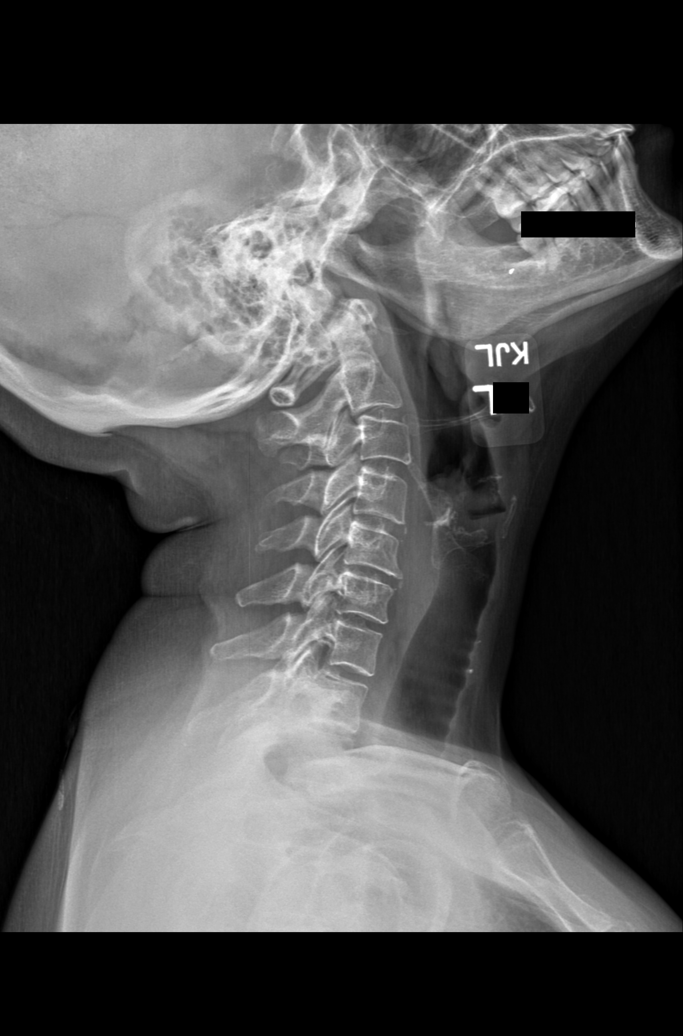

[6 of 6 positions shown; findings below may reference images not displayed]

FINDINGS: Mild scattered disc height loss and osteophytic spurring. Trace 
retrolisthesis C2 on C3 and extension and neutral positioning with reduction 
with flexion positioning. No vertebral body fracture. Mild disc height loss and 
osteophytic spurring C5-C6. Lung apices are clear.
IMPRESSION: Spondylotic changes cervical spine. Consideration could be made for MR exam.

## 2023-04-04 ENCOUNTER — Encounter: Admit: 2023-04-04 | Payer: BLUE CROSS/BLUE SHIELD | Attending: Internal Medicine | Primary: Internal Medicine

## 2023-05-07 ENCOUNTER — Encounter: Admit: 2023-05-07 | Payer: PRIVATE HEALTH INSURANCE

## 2023-05-07 ENCOUNTER — Encounter: Admit: 2023-05-07 | Payer: PRIVATE HEALTH INSURANCE | Attending: Internal Medicine

## 2023-06-03 ENCOUNTER — Encounter: Admit: 2023-06-03 | Payer: BLUE CROSS/BLUE SHIELD | Attending: Internal Medicine

## 2023-09-16 IMAGING — MG MAMMOGRAPHY SCREENING BILATERAL 3[PERSON_NAME]
8 series · 8 of 24 positions shown · non-contrast
Comparison: 06/09/2022 and dating back to 02/21/2018

________________________________________________________________________________________________ 
MAMMOGRAPHY SCREENING BILATERAL [id number], 09/16/2023 [DATE]: 
CLINICAL INDICATION: Encounter for screening mammogram.
TECHNIQUE: Digital bilateral mammograms and 3-D Tomosynthesis were obtained. 
These were interpreted both primarily and with the aid of computer-aided 
detection system.  
BREAST DENSITY: (Level C) The breasts are heterogeneously dense, which may 
obscure small masses.

[L MLO]
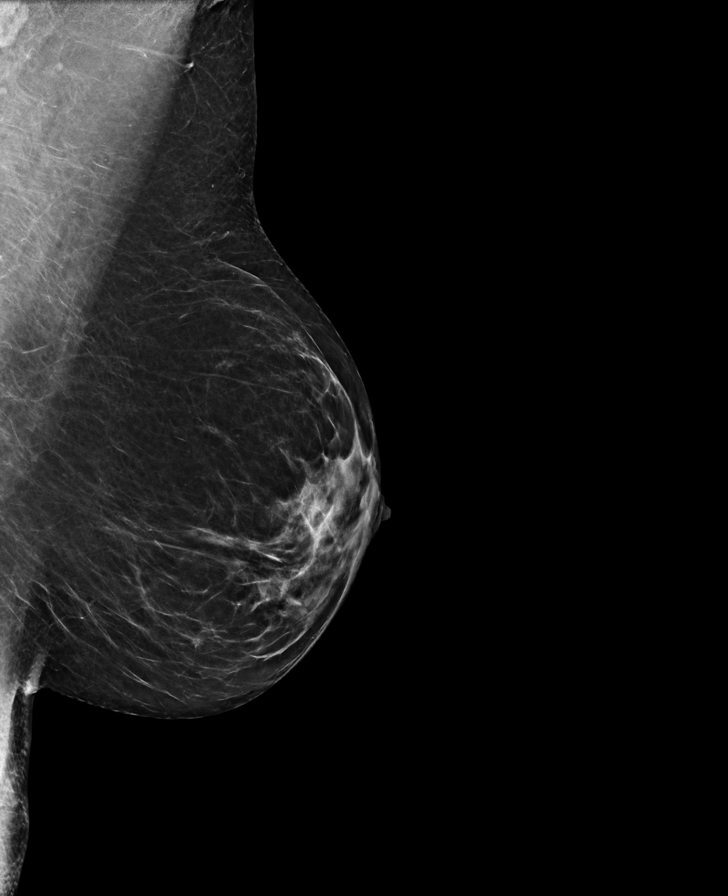

[R CC]
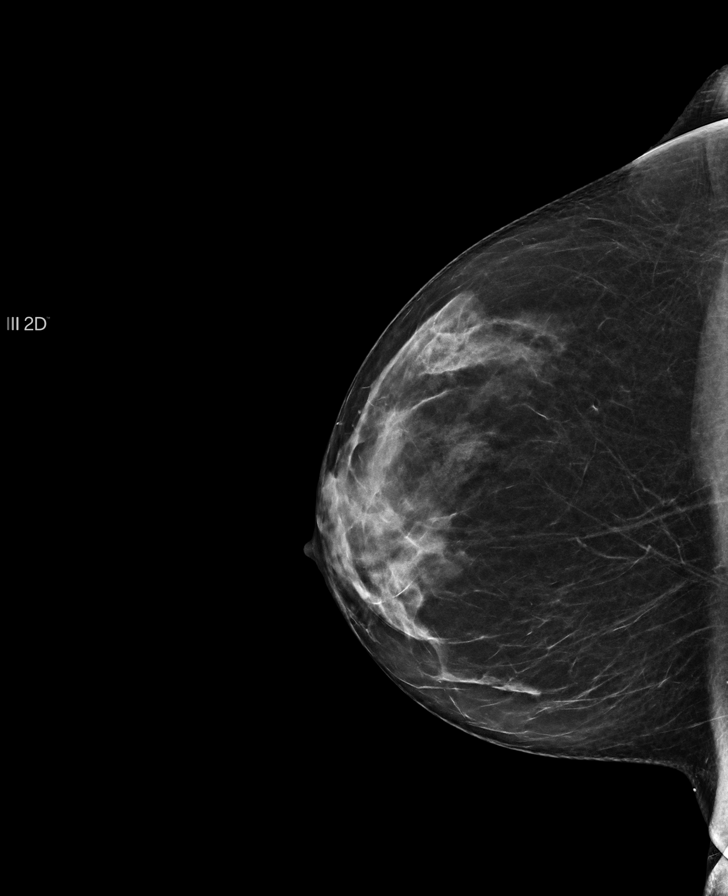

[L CC]
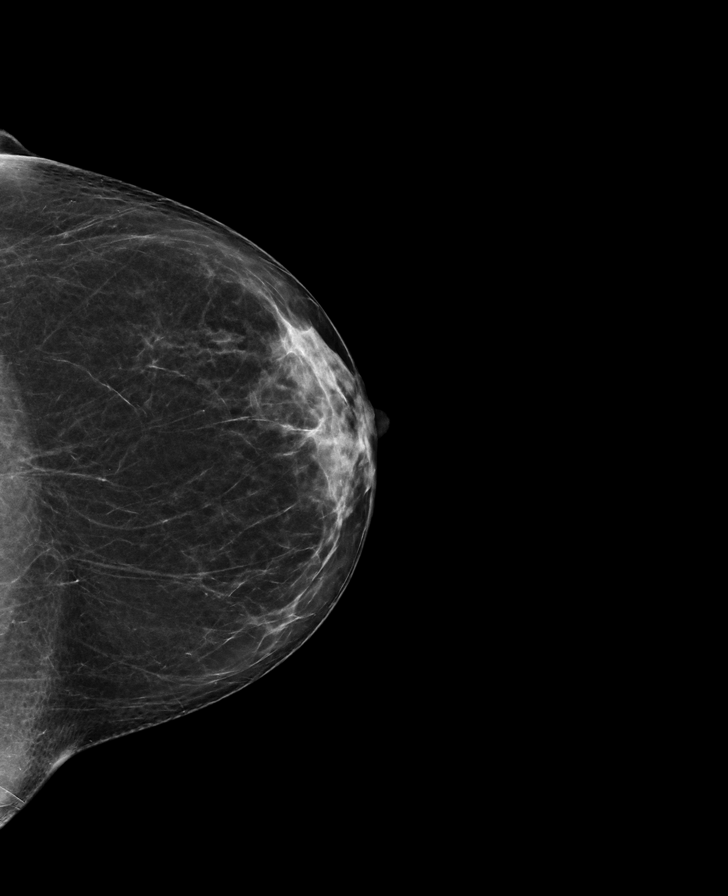

[R MLO]
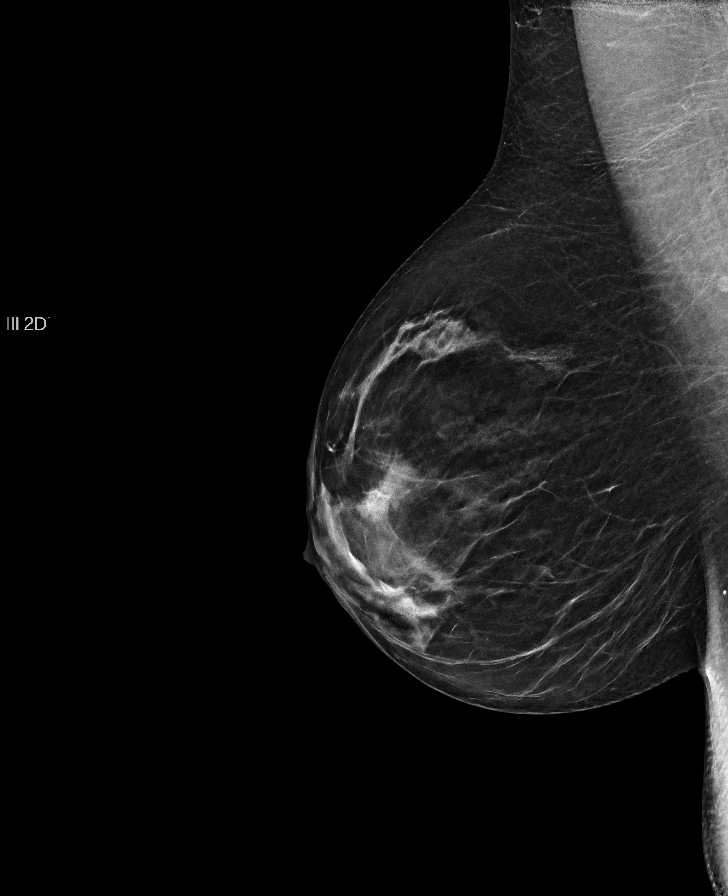

[R CC tomo · tomo slice 9/18.0]
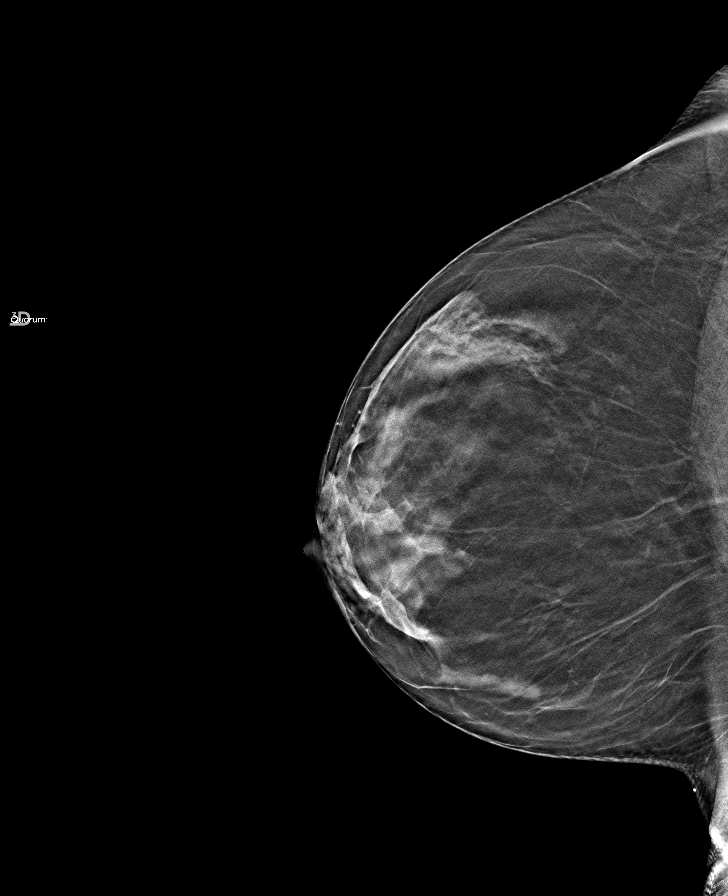

[L MLO tomo · tomo slice 11/21.0]
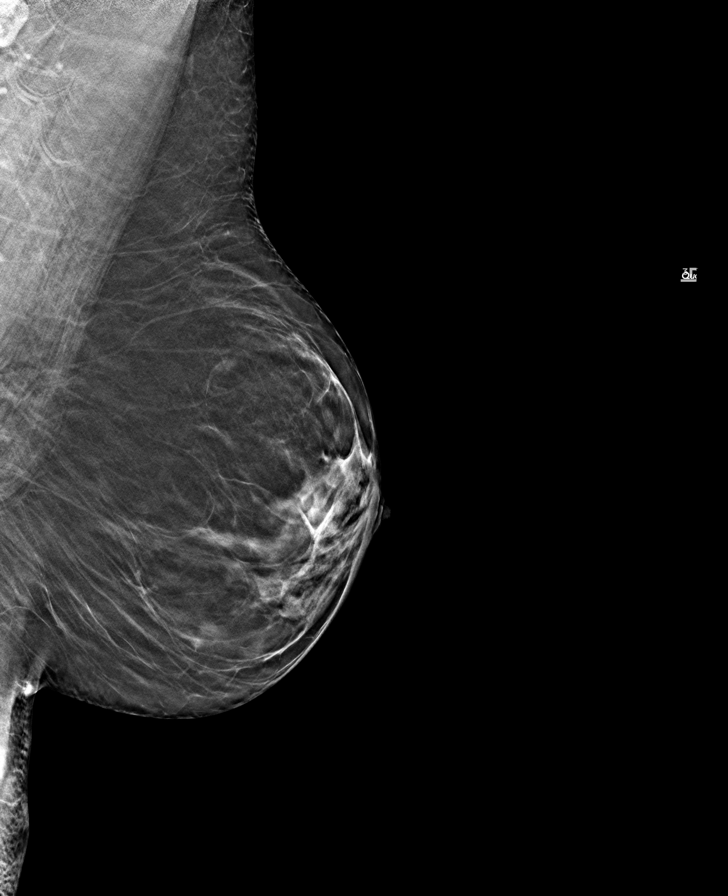

[L CC tomo · tomo slice 10/19.0]
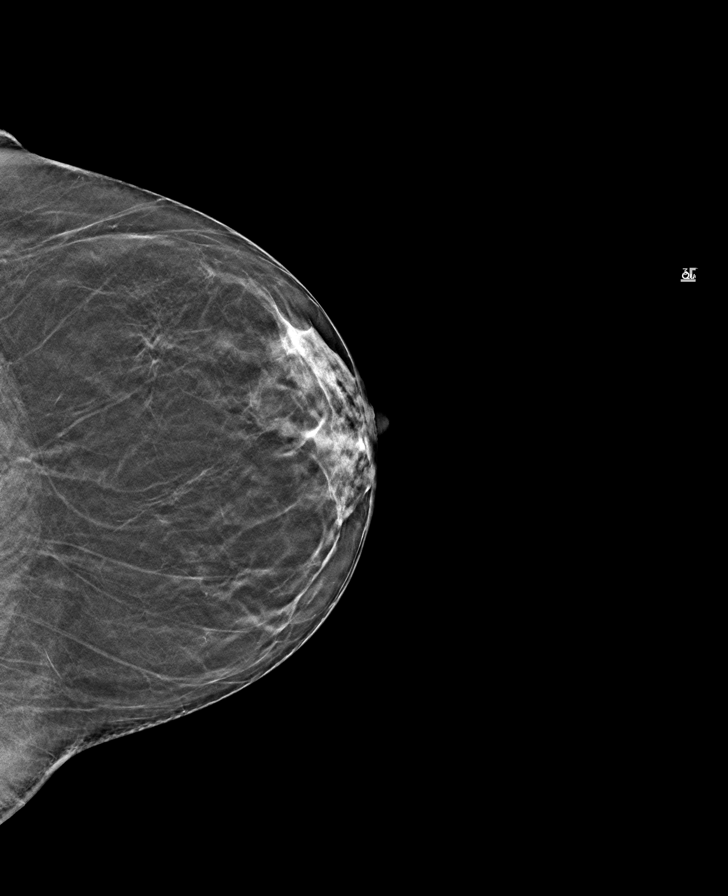

[R MLO tomo · tomo slice 10/19.0]
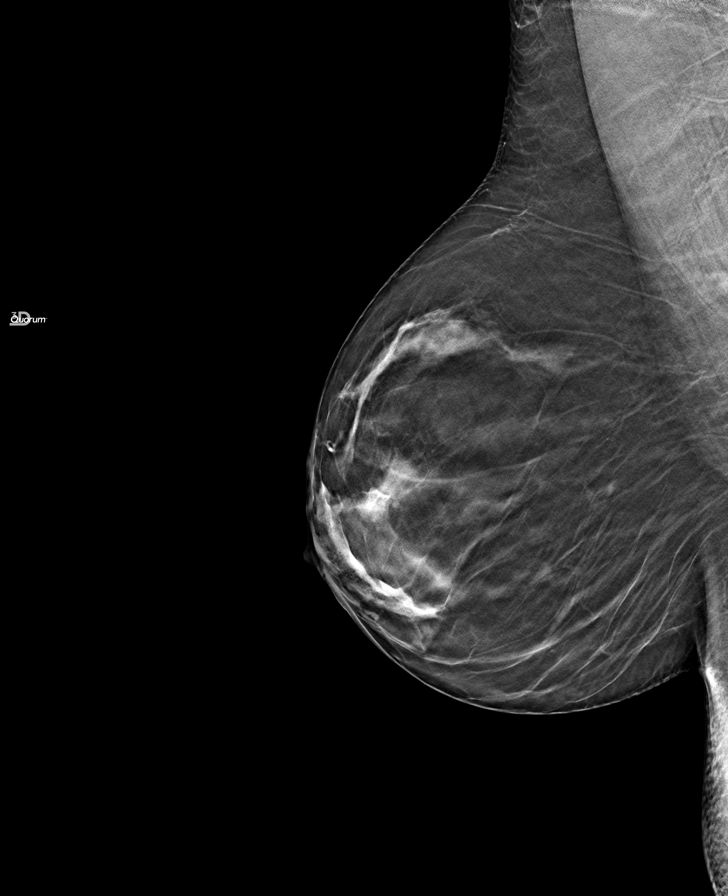

[8 of 24 positions shown; findings below may reference images not displayed]

FINDINGS: No suspicious mass, calcifications, or area of architectural 
distortion in either breast. Overall stable mammographic appearance.
IMPRESSION: No mammographic findings suggestive for malignancy. 
(BI-RADS 2) Benign findings. Routine mammographic follow-up is recommended.

## 2023-10-29 IMAGING — CT CT LUNG SCREENING
2 of 4 series · 7 of 36 positions shown, 8 images · non-contrast
Comparison: None 
Count of known CT and Cardiac Nuclear Medicine studies performed in the previous 
12 months = 1.

________________________________________________________________________________________________ 
CT LUNG SCREENING, 10/29/2023 [DATE]: 
CLINICAL INDICATION: Encntr screen for malignant neoplasm of respiratory organs. 
Nicotine dependence, cigarettes, uncomplicated. Personal history of nicotine 
dependence 
A search for DICOM formatted images was conducted for prior CT imaging studies 
completed at a non-affiliated media free facility.
TECHNIQUE: The chest was scanned from base of neck through the lung bases 
without contrast on a high resolution low dose CT scanner. Routine MPR and MIP 
reconstruction images were performed.

[Series 4: coronal · coronal · 0.57mm/px · 3 of 128 slices shown]
[im 26/128  lung]
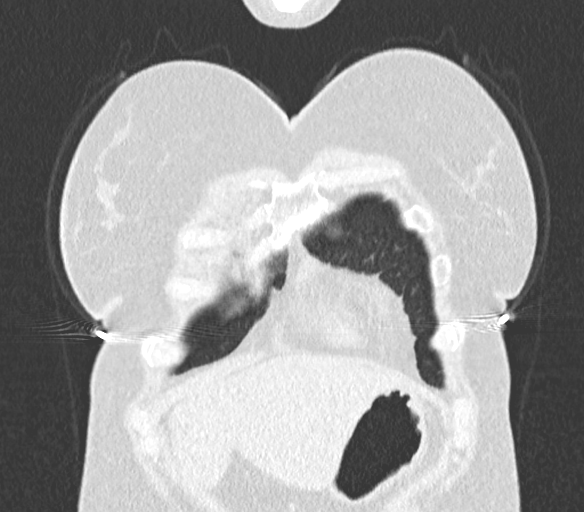
[im 51/128  lung]
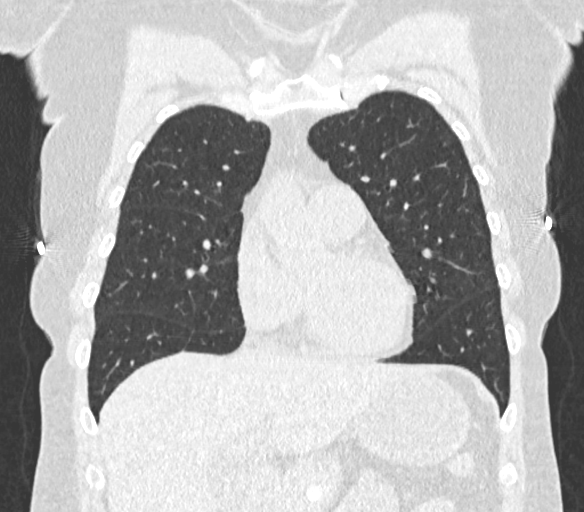
[im 77/128  lung]
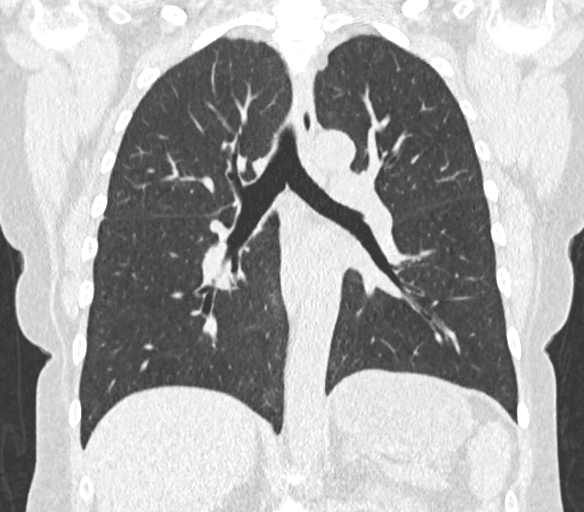

[Series 6: lung mips · axial · 0.55mm/px · z∈[-227,-76]mm · 4 of 42 slices shown, 5 images]
[im 9/42  mediastinal]
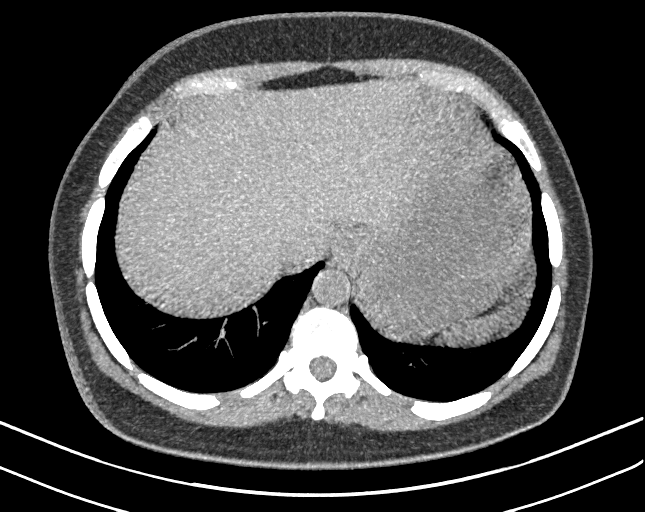
[im 9/42  lung]
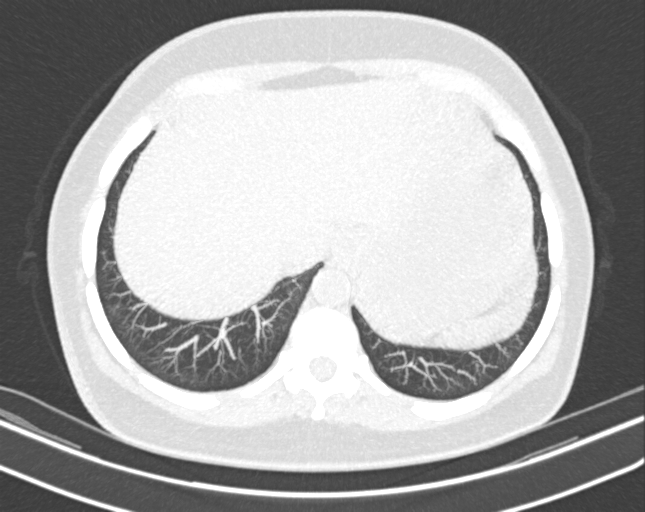
[im 17/42  lung]
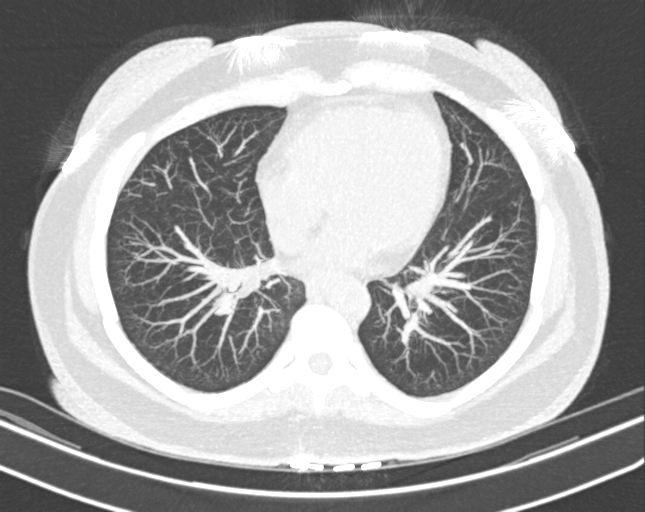
[im 25/42  lung]
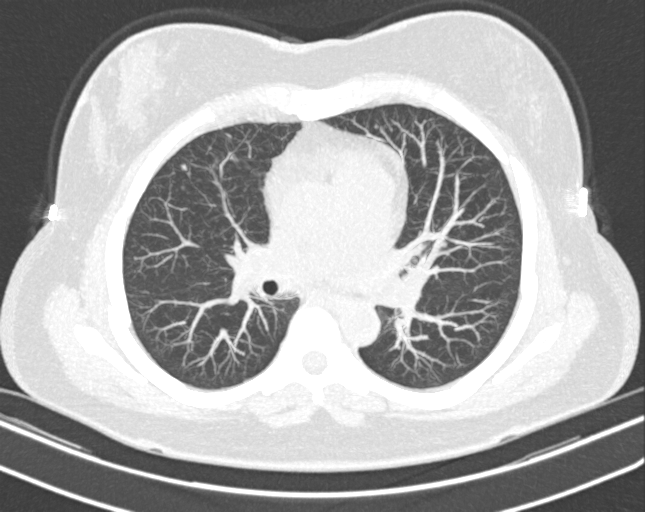
[im 33/42  lung]
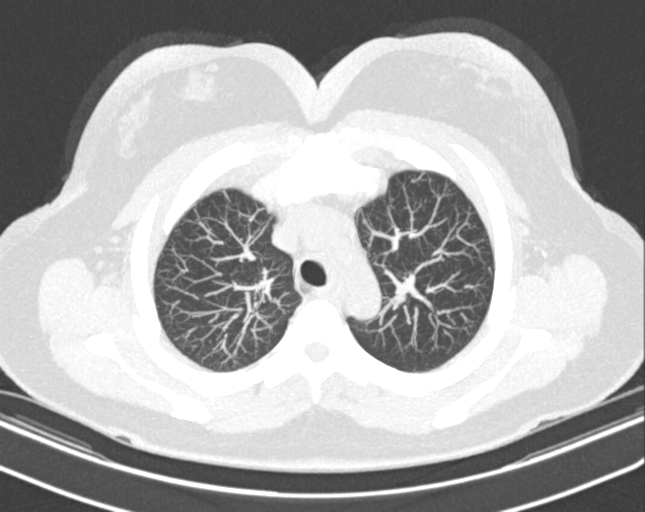

[7 of 36 positions shown; findings below may reference images not displayed]

FINDINGS: LUNGS AND PLEURA:  Scattered noncalcified micronodules largest is along the 
inferior aspect of the minor fissure and measures 3 mm/best seen on image 18/42. 
No acute consolidation. No pleural effusion.  
MEDIASTINUM:  No adenopathy. Normal heart size. No pericardial effusion. No 
coronary artery calcifications noted. 
CHEST WALL/AXILLA: No mass or adenopathy.  
UPPER ABDOMEN: Negative. 
MUSCULOSKELETAL: No acute abnormality.
IMPRESSION: Noncalcified micronodule measuring 3 mm adjacent to the minor fissure. 
L-RADS: Category 2 (benign appearance, <1% chance of malignancy) 
Solid nodule(s) 
< 6mm 
New nodule <4 mm 
Subsolid nodule(s) 
<6 mm on baseline screening 
Ground glass nodule(s) 
<30 mm 
Greater than or equal to 30 mm and unchanged or slowly growing 
Category 3 or 4 nodules that are unchanged for Greater than or equal to 3 months 
Recommended follow up 
Category 2: continue annual screening with LDCT 

RADIATION DOSE REDUCTION: All CT scans are performed using radiation dose 
reduction techniques, when applicable.  Technical factors are evaluated and 
adjusted to ensure appropriate moderation of exposure.  Automated dose 
management technology is applied to adjust the radiation doses to minimize 
exposure while achieving diagnostic quality images.

## 2023-11-12 ENCOUNTER — Encounter: Admit: 2023-11-12 | Payer: PRIVATE HEALTH INSURANCE | Attending: Internal Medicine

## 2023-11-13 MED ORDER — ROSUVASTATIN 5 MG TABLET
5 | ORAL_TABLET | ORAL | 1 refills | 90.00000 days | Status: AC
Start: 2023-11-13 — End: ?

## 2023-11-14 ENCOUNTER — Encounter: Admit: 2023-11-14 | Payer: PRIVATE HEALTH INSURANCE | Attending: Internal Medicine

## 2024-01-19 ENCOUNTER — Encounter: Admit: 2024-01-19 | Payer: PRIVATE HEALTH INSURANCE | Attending: Internal Medicine

## 2024-06-22 MED ORDER — CLOTRIMAZOLE-BETAMETHASONE 1 %-0.05 % TOPICAL CREAM
1-0.05 | TOPICAL | 1 refills | 23.00000 days | Status: AC
Start: 2024-06-22 — End: ?

## 2024-08-04 ENCOUNTER — Encounter: Admit: 2024-08-04 | Payer: PRIVATE HEALTH INSURANCE | Attending: Internal Medicine | Primary: Internal Medicine
